# Patient Record
Sex: Male | Born: 1975 | Hispanic: Yes | Marital: Single | Smoking: Never smoker
Health system: Southern US, Community
[De-identification: ages and names within clinical notes are randomized; demographics above are authoritative.]

## PROBLEM LIST (undated history)

## (undated) DIAGNOSIS — E785 Hyperlipidemia, unspecified: Secondary | ICD-10-CM

## (undated) DIAGNOSIS — I639 Cerebral infarction, unspecified: Secondary | ICD-10-CM

## (undated) DIAGNOSIS — I10 Essential (primary) hypertension: Secondary | ICD-10-CM

## (undated) DIAGNOSIS — Z789 Other specified health status: Secondary | ICD-10-CM

## (undated) HISTORY — DX: Cerebral infarction, unspecified: I63.9

## (undated) HISTORY — PX: NO PAST SURGERIES: SHX2092

## (undated) HISTORY — DX: Essential (primary) hypertension: I10

## (undated) HISTORY — DX: Hyperlipidemia, unspecified: E78.5

## (undated) HISTORY — PX: APPENDECTOMY: SHX54

---

## 2015-08-17 ENCOUNTER — Emergency Department (HOSPITAL_COMMUNITY): Payer: Self-pay

## 2015-08-17 ENCOUNTER — Inpatient Hospital Stay (HOSPITAL_COMMUNITY): Payer: Self-pay

## 2015-08-17 ENCOUNTER — Encounter (HOSPITAL_COMMUNITY): Payer: Self-pay

## 2015-08-17 ENCOUNTER — Inpatient Hospital Stay (HOSPITAL_COMMUNITY)
Admission: EM | Admit: 2015-08-17 | Discharge: 2015-08-19 | DRG: 064 | Disposition: A | Discharge status: Home / Self Care | Payer: Self-pay | Attending: Internal Medicine | Admitting: Internal Medicine

## 2015-08-17 DIAGNOSIS — E785 Hyperlipidemia, unspecified: Secondary | ICD-10-CM | POA: Diagnosis present

## 2015-08-17 DIAGNOSIS — N19 Unspecified kidney failure: Secondary | ICD-10-CM

## 2015-08-17 DIAGNOSIS — R471 Dysarthria and anarthria: Secondary | ICD-10-CM | POA: Diagnosis present

## 2015-08-17 DIAGNOSIS — R748 Abnormal levels of other serum enzymes: Secondary | ICD-10-CM | POA: Diagnosis present

## 2015-08-17 DIAGNOSIS — R9389 Abnormal findings on diagnostic imaging of other specified body structures: Secondary | ICD-10-CM | POA: Insufficient documentation

## 2015-08-17 DIAGNOSIS — I161 Hypertensive emergency: Secondary | ICD-10-CM | POA: Diagnosis present

## 2015-08-17 DIAGNOSIS — I6783 Posterior reversible encephalopathy syndrome: Secondary | ICD-10-CM | POA: Diagnosis present

## 2015-08-17 DIAGNOSIS — H539 Unspecified visual disturbance: Secondary | ICD-10-CM | POA: Diagnosis present

## 2015-08-17 DIAGNOSIS — D72829 Elevated white blood cell count, unspecified: Secondary | ICD-10-CM

## 2015-08-17 DIAGNOSIS — I739 Peripheral vascular disease, unspecified: Secondary | ICD-10-CM | POA: Diagnosis present

## 2015-08-17 DIAGNOSIS — I639 Cerebral infarction, unspecified: Secondary | ICD-10-CM

## 2015-08-17 DIAGNOSIS — N289 Disorder of kidney and ureter, unspecified: Secondary | ICD-10-CM

## 2015-08-17 DIAGNOSIS — N281 Cyst of kidney, acquired: Secondary | ICD-10-CM | POA: Diagnosis present

## 2015-08-17 DIAGNOSIS — I160 Hypertensive urgency: Secondary | ICD-10-CM | POA: Diagnosis present

## 2015-08-17 DIAGNOSIS — I6330 Cerebral infarction due to thrombosis of unspecified cerebral artery: Principal | ICD-10-CM | POA: Diagnosis present

## 2015-08-17 DIAGNOSIS — E876 Hypokalemia: Secondary | ICD-10-CM | POA: Diagnosis present

## 2015-08-17 DIAGNOSIS — N179 Acute kidney failure, unspecified: Secondary | ICD-10-CM | POA: Diagnosis not present

## 2015-08-17 DIAGNOSIS — R000 Tachycardia, unspecified: Secondary | ICD-10-CM | POA: Diagnosis present

## 2015-08-17 DIAGNOSIS — Z8249 Family history of ischemic heart disease and other diseases of the circulatory system: Secondary | ICD-10-CM

## 2015-08-17 DIAGNOSIS — I10 Essential (primary) hypertension: Secondary | ICD-10-CM

## 2015-08-17 HISTORY — DX: Other specified health status: Z78.9

## 2015-08-17 LAB — BASIC METABOLIC PANEL
Anion gap: 11 (ref 5–15)
BUN: 25 mg/dL — ABNORMAL HIGH (ref 6–20)
CALCIUM: 8.7 mg/dL — AB (ref 8.9–10.3)
CO2: 23 mmol/L (ref 22–32)
CREATININE: 1.25 mg/dL — AB (ref 0.61–1.24)
Chloride: 104 mmol/L (ref 101–111)
Glucose, Bld: 118 mg/dL — ABNORMAL HIGH (ref 65–99)
Potassium: 3.3 mmol/L — ABNORMAL LOW (ref 3.5–5.1)
SODIUM: 138 mmol/L (ref 135–145)

## 2015-08-17 LAB — TSH: TSH: 1.202 u[IU]/mL (ref 0.350–4.500)

## 2015-08-17 LAB — CBC WITH DIFFERENTIAL/PLATELET
BASOS ABS: 0 10*3/uL (ref 0.0–0.1)
Basophils Relative: 0 %
EOS PCT: 0 %
Eosinophils Absolute: 0 10*3/uL (ref 0.0–0.7)
HCT: 41.5 % (ref 39.0–52.0)
Hemoglobin: 14.6 g/dL (ref 13.0–17.0)
LYMPHS PCT: 9 %
Lymphs Abs: 1.1 10*3/uL (ref 0.7–4.0)
MCH: 31.3 pg (ref 26.0–34.0)
MCHC: 35.2 g/dL (ref 30.0–36.0)
MCV: 88.9 fL (ref 78.0–100.0)
Monocytes Absolute: 0.5 10*3/uL (ref 0.1–1.0)
Monocytes Relative: 4 %
NEUTROS ABS: 10.3 10*3/uL — AB (ref 1.7–7.7)
NEUTROS PCT: 87 %
PLATELETS: 232 10*3/uL (ref 150–400)
RBC: 4.67 MIL/uL (ref 4.22–5.81)
RDW: 13.2 % (ref 11.5–15.5)
WBC: 12 10*3/uL — AB (ref 4.0–10.5)

## 2015-08-17 LAB — TROPONIN I: Troponin I: 0.04 ng/mL — ABNORMAL HIGH (ref <0.031)

## 2015-08-17 LAB — MAGNESIUM: MAGNESIUM: 2.2 mg/dL (ref 1.7–2.4)

## 2015-08-17 LAB — MRSA PCR SCREENING: MRSA BY PCR: NEGATIVE

## 2015-08-17 MED ORDER — ACETAMINOPHEN 650 MG RE SUPP: 650.0000 mg | Freq: Four times a day (QID) | RECTAL | Status: DC | PRN

## 2015-08-17 MED ORDER — POTASSIUM CHLORIDE CRYS ER 20 MEQ PO TBCR
40.0000 meq | EXTENDED_RELEASE_TABLET | Freq: Once | ORAL | Status: AC
  Administered 2015-08-17: 40 meq via ORAL
  Filled 2015-08-17: qty 2

## 2015-08-17 MED ORDER — ONDANSETRON HCL 4 MG/2ML IJ SOLN: 4.0000 mg | Freq: Four times a day (QID) | INTRAMUSCULAR | Status: DC | PRN

## 2015-08-17 MED ORDER — ONDANSETRON HCL 4 MG PO TABS: 4.0000 mg | ORAL_TABLET | Freq: Four times a day (QID) | ORAL | Status: DC | PRN

## 2015-08-17 MED ORDER — ACETAMINOPHEN 325 MG PO TABS
650.0000 mg | ORAL_TABLET | Freq: Four times a day (QID) | ORAL | Status: DC | PRN
  Administered 2015-08-18: 650 mg via ORAL
  Filled 2015-08-17: qty 2

## 2015-08-17 MED ORDER — LABETALOL HCL 5 MG/ML IV SOLN
0.5000 mg/min | INTRAVENOUS | Status: DC
  Administered 2015-08-17: 0.5 mg/min via INTRAVENOUS
  Filled 2015-08-17: qty 20

## 2015-08-17 MED ORDER — LABETALOL HCL 5 MG/ML IV SOLN
20.0000 mg | Freq: Once | INTRAVENOUS | Status: AC
  Administered 2015-08-17: 20 mg via INTRAVENOUS
  Filled 2015-08-17: qty 4

## 2015-08-17 MED ORDER — SODIUM CHLORIDE 0.45 % IV SOLN
INTRAVENOUS | Status: DC
  Administered 2015-08-17 – 2015-08-18 (×2): via INTRAVENOUS

## 2015-08-17 MED ORDER — LORAZEPAM 2 MG/ML IJ SOLN
1.0000 mg | Freq: Once | INTRAMUSCULAR | Status: AC
  Administered 2015-08-17: 1 mg via INTRAVENOUS
  Filled 2015-08-17: qty 1

## 2015-08-17 MED ORDER — MORPHINE SULFATE (PF) 2 MG/ML IV SOLN: 2.0000 mg | INTRAVENOUS | Status: DC | PRN

## 2015-08-17 MED ORDER — ENOXAPARIN SODIUM 40 MG/0.4ML ~~LOC~~ SOLN
40.0000 mg | SUBCUTANEOUS | Status: DC
  Administered 2015-08-17 – 2015-08-18 (×2): 40 mg via SUBCUTANEOUS
  Filled 2015-08-17 (×2): qty 0.4

## 2015-08-17 NOTE — ED Notes (Signed)
Delay in lab due to pt not being in room

## 2015-08-17 NOTE — Progress Notes (Signed)
Merdis DelayK. Schorr, paged regarding patient SBP 130-150 per last hour (2015-2100). K. Schorr instructed RN to titrate labetolol to 0.25 mg/min. Will continue to monitor.

## 2015-08-17 NOTE — H&P (Signed)
[facility] History and Physical  Chris Phelps WUJ:[id number] DOB: [birth year] DOA: 08/17/2015  Referring physician: Dr. Doug Sou, EDP PCP: No primary care provider on file.  Specialists:   Chief Complaint: Elevated blood pressure, right visual changes  HPI: Chris Phelps is a 40 y.o. male  With no past medical history, presented to the emergency department with complaints of high blood pressure and decreased vision. It seems the patient had presented to [facility] optometry or center today, as he has been having decreased vision with blurriness in the right eye for the past 11 days. Patient was told that he needed to see a retinal specialist. Patient was also told that he had an elevated pressure in the left eye versus the right eye. Patient denies any recent illness or travel. Has had a cough for the last several days. Denies any chest pain, shortness of breath, abdominal pain, nausea or vomiting. In the emergency department, CT head was unremarkable for acute findings. Patient was noted to have an elevated blood pressure of 268/142 upon arrival to the emergency department. He was given labetalol injections twice. Patient was found to have hypokalemia, potassium 3.3, creatinine 1.25, WBC 12. [facility] called for admission.  Review of Systems:  Constitutional: Denies fever, chills, diaphoresis, appetite change and fatigue.  HEENT: Denies photophobia, eye pain, redness, hearing loss, ear pain, congestion, sore throat, rhinorrhea, sneezing, mouth sores, trouble swallowing, neck pain, neck stiffness and tinnitus.   Respiratory: Denies SOB, DOE, cough, chest tightness,  and wheezing.   Cardiovascular: Denies chest pain, palpitations and leg swelling.  Gastrointestinal: Denies nausea, vomiting, abdominal pain, diarrhea, constipation, blood in stool and abdominal distention.  Genitourinary: Denies dysuria, urgency, frequency, hematuria, flank pain and difficulty urinating.  Musculoskeletal:  Denies myalgias, back pain, joint swelling, arthralgias and gait problem.  Skin: Denies pallor, rash and wound.  Neurological: Complains of decreased vision and blurring in the right eye.  Hematological: Denies adenopathy. Easy bruising, personal or family bleeding history  Psychiatric/Behavioral: Denies suicidal ideation, mood changes, confusion, nervousness, sleep disturbance and agitation  Past Medical History  Diagnosis Date  . Medical history non-contributory    Past Surgical History  Procedure Laterality Date  . No past surgeries     Social History:  reports that he has never smoked. He has never used smokeless tobacco. He reports that he does not drink alcohol or use illicit drugs.   No Known Allergies  Family History  Problem Relation Age of Onset  . Hypertension     Prior to Admission medications   Medication Sig Start Date End Date Taking? Authorizing Provider  ibuprofen (ADVIL,MOTRIN) 200 MG tablet Take 200 mg by mouth every 6 (six) hours as needed for fever, headache, mild pain, moderate pain or cramping.   Yes Historical Provider, MD   Physical Exam: Filed Vitals:   08/17/15 1600 08/17/15 1615  BP: 207/124 240/151  Pulse: 78 79  Temp:    Resp: 16 22     General: Well developed, well nourished, NAD, appears stated age  HEENT: NCAT, PERRLA-pupils dilated 4mm, EOMI, Anicteic Sclera, mucous membranes moist.   Neck: Supple, no JVD, no masses  Cardiovascular: S1 S2 auscultated, no rubs, murmurs or gallops. Tachycardic  Respiratory: Clear to auscultation bilaterally with equal chest rise  Abdomen: Soft, nontender, nondistended, + bowel sounds  Extremities: warm dry without cyanosis clubbing or edema  Neuro: AAOx3, cranial nerves grossly intact. Right visual field defects- can see peripherally however not straight ahead, pupils dilated. Strength 5/5 in patient's upper  and lower extremities bilaterally  Skin: Without rashes exudates or nodules  Psych: Normal  affect and demeanor with intact judgement and insight  Labs on Admission:  Basic Metabolic Panel:  Recent Labs Lab 08/17/15 1443  NA 138  K 3.3*  CL 104  CO2 23  GLUCOSE 118*  BUN 25*  CREATININE 1.25*  CALCIUM 8.7*   Liver Function Tests: No results for input(s): AST, ALT, ALKPHOS, BILITOT, PROT, ALBUMIN in the last 168 hours. No results for input(s): LIPASE, AMYLASE in the last 168 hours. No results for input(s): AMMONIA in the last 168 hours. CBC:  Recent Labs Lab 08/17/15 1443  WBC 12.0*  NEUTROABS 10.3*  HGB 14.6  HCT 41.5  MCV 88.9  PLT 232   Cardiac Enzymes: No results for input(s): CKTOTAL, CKMB, CKMBINDEX, TROPONINI in the last 168 hours.  BNP (last 3 results) No results for input(s): BNP in the last 8760 hours.  ProBNP (last 3 results) No results for input(s): PROBNP in the last 8760 hours.  CBG: No results for input(s): GLUCAP in the last 168 hours.  Radiological Exams on Admission: Ct Head Wo Contrast  08/17/2015  CLINICAL DATA:  40 year old male with vision changes in the right eye for 11 days. Hypertensive. Initial encounter. EXAM: CT HEAD WITHOUT CONTRAST TECHNIQUE: Contiguous axial images were obtained from the base of the skull through the vertex without intravenous contrast. COMPARISON:  None. FINDINGS: No acute osseous abnormality identified. Scattered mostly small paranasal sinus mucous retention cyst. Mastoids are clear. Orbits soft tissues appear normal. Visualized scalp soft tissues are within normal limits. Cerebral volume is normal. Multifocal abnormal hypodense areas in the cerebral white matter and basal ganglia bilaterally suggestion of a small area of cortical encephalomalacia in the right parietal lobe on series 2, image 20. No cortically based acute infarct identified. No midline shift, mass effect, or evidence of intracranial mass lesion. No acute intracranial hemorrhage identified. No ventriculomegaly. No suspicious intracranial vascular  hyperdensity. IMPRESSION: 1. Heterogeneous density in the bilateral cerebral white matter and basal ganglia is nonspecific but may indicate very age advanced small vessel disease in this setting. 2. Otherwise no acute intracranial abnormality. Electronically Signed   By: Odessa FlemingH  Hall M.D.   On: 08/17/2015 14:35    EKG: Independently reviewed. Sinus tachycardia, rate 104, Abnormal Twave, LVH  Assessment/Plan  Hypertensive Urgency -Will admit patient to stepdown placed on labetalol infusion -CT head: Heterogeneous density in the bilateral cerebral white matter and basal ganglia, no acute intracranial normality -MRI brain pending  Visual changes -Has been ongoing for approximately 11 days -Possibly ongoing from HTN? -CT head as above -Pending MRI brain -Spoke with ophthalmologist, Dr. Fabian SharpScott Groat, who will follow up with patient as an outpatient.  Leukocytosis -Patient has complained of cough over the last several days -Denies shortness of breath -Have asked EDP to obtain chest x-ray and UA -Currently afebrile -Will defer antibiotics for now -Continue to monitor CBC  Mild renal insufficiency -No baseline creatinine -Creatinine at admission 1.25 -Placed on gentle IVF and continue to monitor BMP  Hypokalemia -Will obtain magnesium level, replace potassium -Monitor BMP  Sinus tachycardia/abnormal EKG -TSH 1.202 -Spoke with Dr. [place] to review EKG, felt EKG changes related to LVH and strain. -Will obtain echocardiogram -Will repeat EKG in the a.m.  DVT prophylaxis: Lovenox  Code Status: Full  Condition: guarded   Family Communication: Friend at bedside. Admission, patients condition and plan of care including tests being ordered have been discussed with the patient and friend  who indicate understanding and agree with the plan and Code Status.  Disposition Plan: Admitted to step down unit.  Time spent: 75 minutes  Halil Rentz D.O. [facility] Pager  [phone]  If 7PM-7AM, please contact night-coverage [email or web address] Password [email or web address] 08/17/2015, 4:31 PM

## 2015-08-17 NOTE — ED Notes (Signed)
Pt c/o vision change in R eye x 11 days.  Denies pain.  Pt was seen at eye doctor today and found to be hypertensive.  Visitor sts "it was 270 over something."  Eye doctor has referred Pt to a retinal specialist.  Denies medical history.

## 2015-08-17 NOTE — ED Provider Notes (Addendum)
CSN: [id number]     Arrival date & time 08/17/15  1217 History   First MD Initiated Contact with Patient 08/17/15 1316     Chief Complaint  Patient presents with  . Hypertension  . Visual Field Change     (Consider location/radiation/quality/duration/timing/severity/associated sxs/prior Treatment) HPI patient went to optometrist today as he's been having blurred vision in his right eye for the past 11 days. He was sent here for evaluation of elevated blood pressure. Patient is asymptomatic except for blurred vision in his right eye. Denies headache denies chest pain denies shortness of breath denies other associated symptoms. No treatment prior to coming here, other than eyedrops placed in both eyes by optometrist.   History reviewed. No pertinent past medical history. Past history negative History reviewed. No pertinent past surgical history. History reviewed. No pertinent family history. Social History  Substance Use Topics  . Smoking status: Never Smoker   . Smokeless tobacco: None  . Alcohol Use: No    Review of Systems  Constitutional: Negative.   HENT: Negative.   Eyes: Positive for visual disturbance.  Respiratory: Negative.   Cardiovascular: Negative.   Gastrointestinal: Negative.   Musculoskeletal: Negative.   Skin: Negative.   Neurological: Negative.   Psychiatric/Behavioral: Negative.   All other systems reviewed and are negative.     Allergies  Review of patient's allergies indicates no known allergies.  Home Medications   Prior to Admission medications   Medication Sig Start Date End Date Taking? Authorizing Provider  ibuprofen (ADVIL,MOTRIN) 200 MG tablet Take 200 mg by mouth every 6 (six) hours as needed for fever, headache, mild pain, moderate pain or cramping.   Yes Historical Provider, MD   BP 266/157 mmHg  Pulse 109  Temp(Src) 98.9 F (37.2 C) (Oral)  Resp 23  SpO2 93% Physical Exam  Constitutional: He appears well-developed and well-nourished.   HENT:  Head: Normocephalic and atraumatic.  Eyes: Conjunctivae and EOM are normal.  Pupils 4-5 mm bilaterally and nonreactive to light visual acuity 20/25 left eye, he is able to see hand motion with right eye but unable to see hand  Neck: Neck supple. No tracheal deviation present. No thyromegaly present.  Cardiovascular: Regular rhythm.   No murmur heard. Tachycardic  Pulmonary/Chest: Effort normal and breath sounds normal.  Abdominal: Soft. Bowel sounds are normal. He exhibits no distension. There is no tenderness.  Musculoskeletal: Normal range of motion. He exhibits no edema or tenderness.  Neurological: He is alert. Coordination normal.  Skin: Skin is warm and dry. No rash noted.  Psychiatric: He has a normal mood and affect.  Nursing note and vitals reviewed.   ED Course  Procedures (including critical care time) Labs Review Labs Reviewed  BASIC METABOLIC PANEL  CBC WITH DIFFERENTIAL/PLATELET  DRUG PROFILE, UR, 9 DRUGS  TSH    Imaging Review No results found. I have personally reviewed and evaluated these images and lab results as part of my medical decision-making.   EKG Interpretation   Date/Time:  Wednesday August 17 2015 12:41:56 EDT Ventricular Rate:  104 PR Interval:  157 QRS Duration: 99 QT Interval:  352 QTC Calculation: 463 R Axis:   100 Text Interpretation:  Sinus tachycardia Consider right atrial enlargement  Abnormal T, consider ischemia, lateral leads No old tracing to compare  Confirmed by Oletha Tolson  MD, Nyasia Baxley [phone]) on 08/17/2015 2:17:19 PM     3:35 PM patient remains markedly hypertensive after treatment with intravenous labetalol. Additional intravenous labetalol ordered. Results for orders placed  or performed during the hospital encounter of 08/17/15  Basic metabolic panel  Result Value Ref Range   Sodium 138 135 - 145 mmol/L   Potassium 3.3 (L) 3.5 - 5.1 mmol/L   Chloride 104 101 - 111 mmol/L   CO2 23 22 - 32 mmol/L   Glucose, Bld 118 (H)  65 - 99 mg/dL   BUN 25 (H) 6 - 20 mg/dL   Creatinine, Ser 1.611.25 (H) 0.61 - 1.24 mg/dL   Calcium 8.7 (L) 8.9 - 10.3 mg/dL   GFR calc non Af Amer >60 >60 mL/min   GFR calc Af Amer >60 >60 mL/min   Anion gap 11 5 - 15  CBC with Differential/Platelet  Result Value Ref Range   WBC 12.0 (H) 4.0 - 10.5 K/uL   RBC 4.67 4.22 - 5.81 MIL/uL   Hemoglobin 14.6 13.0 - 17.0 g/dL   HCT 09.641.5 04.539.0 - 40.952.0 %   MCV 88.9 78.0 - 100.0 fL   MCH 31.3 26.0 - 34.0 pg   MCHC 35.2 30.0 - 36.0 g/dL   RDW 81.113.2 91.411.5 - 78.215.5 %   Platelets 232 150 - 400 K/uL   Neutrophils Relative % 87 %   Neutro Abs 10.3 (H) 1.7 - 7.7 K/uL   Lymphocytes Relative 9 %   Lymphs Abs 1.1 0.7 - 4.0 K/uL   Monocytes Relative 4 %   Monocytes Absolute 0.5 0.1 - 1.0 K/uL   Eosinophils Relative 0 %   Eosinophils Absolute 0.0 0.0 - 0.7 K/uL   Basophils Relative 0 %   Basophils Absolute 0.0 0.0 - 0.1 K/uL  TSH  Result Value Ref Range   TSH 1.202 0.350 - 4.500 uIU/mL   Ct Head Wo Contrast  08/17/2015  CLINICAL DATA:  40 year old male with vision changes in the right eye for 11 days. Hypertensive. Initial encounter. EXAM: CT HEAD WITHOUT CONTRAST TECHNIQUE: Contiguous axial images were obtained from the base of the skull through the vertex without intravenous contrast. COMPARISON:  None. FINDINGS: No acute osseous abnormality identified. Scattered mostly small paranasal sinus mucous retention cyst. Mastoids are clear. Orbits soft tissues appear normal. Visualized scalp soft tissues are within normal limits. Cerebral volume is normal. Multifocal abnormal hypodense areas in the cerebral white matter and basal ganglia bilaterally suggestion of a small area of cortical encephalomalacia in the right parietal lobe on series 2, image 20. No cortically based acute infarct identified. No midline shift, mass effect, or evidence of intracranial mass lesion. No acute intracranial hemorrhage identified. No ventriculomegaly. No suspicious intracranial vascular  hyperdensity. IMPRESSION: 1. Heterogeneous density in the bilateral cerebral white matter and basal ganglia is nonspecific but may indicate very age advanced small vessel disease in this setting. 2. Otherwise no acute intracranial abnormality. Electronically Signed   By: Odessa FlemingH  Hall M.D.   On: 08/17/2015 14:35   He passsed stroke swallow screen Results for orders placed or performed during the hospital encounter of 08/17/15  Basic metabolic panel  Result Value Ref Range   Sodium 138 135 - 145 mmol/L   Potassium 3.3 (L) 3.5 - 5.1 mmol/L   Chloride 104 101 - 111 mmol/L   CO2 23 22 - 32 mmol/L   Glucose, Bld 118 (H) 65 - 99 mg/dL   BUN 25 (H) 6 - 20 mg/dL   Creatinine, Ser 9.561.25 (H) 0.61 - 1.24 mg/dL   Calcium 8.7 (L) 8.9 - 10.3 mg/dL   GFR calc non Af Amer >60 >60 mL/min   GFR calc Af Amer >  60 >60 mL/min   Anion gap 11 5 - 15  CBC with Differential/Platelet  Result Value Ref Range   WBC 12.0 (H) 4.0 - 10.5 K/uL   RBC 4.67 4.22 - 5.81 MIL/uL   Hemoglobin 14.6 13.0 - 17.0 g/dL   HCT 96.0 45.4 - 09.8 %   MCV 88.9 78.0 - 100.0 fL   MCH 31.3 26.0 - 34.0 pg   MCHC 35.2 30.0 - 36.0 g/dL   RDW 11.9 14.7 - 82.9 %   Platelets 232 150 - 400 K/uL   Neutrophils Relative % 87 %   Neutro Abs 10.3 (H) 1.7 - 7.7 K/uL   Lymphocytes Relative 9 %   Lymphs Abs 1.1 0.7 - 4.0 K/uL   Monocytes Relative 4 %   Monocytes Absolute 0.5 0.1 - 1.0 K/uL   Eosinophils Relative 0 %   Eosinophils Absolute 0.0 0.0 - 0.7 K/uL   Basophils Relative 0 %   Basophils Absolute 0.0 0.0 - 0.1 K/uL  TSH  Result Value Ref Range   TSH 1.202 0.350 - 4.500 uIU/mL   Ct Head Wo Contrast  08/17/2015  CLINICAL DATA:  40 year old male with vision changes in the right eye for 11 days. Hypertensive. Initial encounter. EXAM: CT HEAD WITHOUT CONTRAST TECHNIQUE: Contiguous axial images were obtained from the base of the skull through the vertex without intravenous contrast. COMPARISON:  None. FINDINGS: No acute osseous abnormality  identified. Scattered mostly small paranasal sinus mucous retention cyst. Mastoids are clear. Orbits soft tissues appear normal. Visualized scalp soft tissues are within normal limits. Cerebral volume is normal. Multifocal abnormal hypodense areas in the cerebral white matter and basal ganglia bilaterally suggestion of a small area of cortical encephalomalacia in the right parietal lobe on series 2, image 20. No cortically based acute infarct identified. No midline shift, mass effect, or evidence of intracranial mass lesion. No acute intracranial hemorrhage identified. No ventriculomegaly. No suspicious intracranial vascular hyperdensity. IMPRESSION: 1. Heterogeneous density in the bilateral cerebral white matter and basal ganglia is nonspecific but may indicate very age advanced small vessel disease in this setting. 2. Otherwise no acute intracranial abnormality. Electronically Signed   By: Odessa Fleming M.D.   On: 08/17/2015 14:35    MDM  Dr Barnabas Harries. Plan patient will be admitted to stepdown unit for blood pressure control Final diagnoses:  None  Patient has EKG changes and mild renal insufficiency and of unknown chronicity patient has become diminished visual acuity per right eye which is most likely an ophthalmologic problem. He may need ophthalmologic consult  Diagnosis #1 accelerated hypertension #2 hypokalemia #3 renal insufficiency   CRITICAL CARE Performed by: Doug Sou Total critical care time: 35 minutes Critical care time was exclusive of separately billable procedures and treating other patients. Critical care was necessary to treat or prevent imminent or life-threatening deterioration. Critical care was time spent personally by me on the following activities: development of treatment plan with patient and/or surrogate as well as nursing, discussions with consultants, evaluation of patient's response to treatment, examination of patient, obtaining history from patient or  surrogate, ordering and performing treatments and interventions, ordering and review of laboratory studies, ordering and review of radiographic studies, pulse oximetry and re-evaluation of patient's condition.   Doug Sou, MD 08/17/15 1642  Doug Sou, MD 08/17/15 [phone]

## 2015-08-17 NOTE — Progress Notes (Signed)
PATIENT SPEAKS SOME ENGLISH-MAY HAVE TO USE SPANISH INTERPRETER IF CYNTHIA IS NOT WITH HIM

## 2015-08-17 NOTE — ED Notes (Signed)
During the visual acuity patient could not see out of the chart out of his right eye.

## 2015-08-17 NOTE — Progress Notes (Signed)
Upon reassessment, patient lethargic and hard to arouse. HR 69, oxygen saturation 96% on room air, and current BP 143/83, MAP of 104. Patient did arouse for neurological assessment. Pupils 4 mm, PERRLA. No pronator drift noted in upper extremities. Will continue to monitor.

## 2015-08-18 ENCOUNTER — Inpatient Hospital Stay (HOSPITAL_COMMUNITY): Payer: MEDICAID

## 2015-08-18 ENCOUNTER — Encounter (HOSPITAL_COMMUNITY): Payer: Self-pay | Admitting: Radiology

## 2015-08-18 ENCOUNTER — Inpatient Hospital Stay (HOSPITAL_COMMUNITY): Payer: Self-pay

## 2015-08-18 DIAGNOSIS — I10 Essential (primary) hypertension: Secondary | ICD-10-CM

## 2015-08-18 DIAGNOSIS — R9389 Abnormal findings on diagnostic imaging of other specified body structures: Secondary | ICD-10-CM | POA: Insufficient documentation

## 2015-08-18 DIAGNOSIS — I639 Cerebral infarction, unspecified: Secondary | ICD-10-CM

## 2015-08-18 DIAGNOSIS — N19 Unspecified kidney failure: Secondary | ICD-10-CM

## 2015-08-18 DIAGNOSIS — I161 Hypertensive emergency: Secondary | ICD-10-CM

## 2015-08-18 DIAGNOSIS — I6330 Cerebral infarction due to thrombosis of unspecified cerebral artery: Secondary | ICD-10-CM

## 2015-08-18 DIAGNOSIS — I6783 Posterior reversible encephalopathy syndrome: Secondary | ICD-10-CM

## 2015-08-18 DIAGNOSIS — R938 Abnormal findings on diagnostic imaging of other specified body structures: Secondary | ICD-10-CM

## 2015-08-18 LAB — RAPID URINE DRUG SCREEN, HOSP PERFORMED
AMPHETAMINES: NOT DETECTED
BENZODIAZEPINES: NOT DETECTED
Barbiturates: NOT DETECTED
Cocaine: NOT DETECTED
Opiates: NOT DETECTED
Tetrahydrocannabinol: NOT DETECTED

## 2015-08-18 LAB — PROTIME-INR
INR: 1.12 (ref 0.00–1.49)
Prothrombin Time: 14.1 seconds (ref 11.6–15.2)

## 2015-08-18 LAB — URINE MICROSCOPIC-ADD ON

## 2015-08-18 LAB — CBC
HEMATOCRIT: 36.3 % — AB (ref 39.0–52.0)
Hemoglobin: 12.5 g/dL — ABNORMAL LOW (ref 13.0–17.0)
MCH: 31.1 pg (ref 26.0–34.0)
MCHC: 34.4 g/dL (ref 30.0–36.0)
MCV: 90.3 fL (ref 78.0–100.0)
PLATELETS: 206 10*3/uL (ref 150–400)
RBC: 4.02 MIL/uL — ABNORMAL LOW (ref 4.22–5.81)
RDW: 13.5 % (ref 11.5–15.5)
WBC: 8.2 10*3/uL (ref 4.0–10.5)

## 2015-08-18 LAB — TROPONIN I
Troponin I: 0.03 ng/mL (ref <0.031)
Troponin I: 0.04 ng/mL — ABNORMAL HIGH (ref <0.031)

## 2015-08-18 LAB — COMPREHENSIVE METABOLIC PANEL
ALBUMIN: 4 g/dL (ref 3.5–5.0)
ALT: 15 U/L — ABNORMAL LOW (ref 17–63)
AST: 12 U/L — AB (ref 15–41)
Alkaline Phosphatase: 42 U/L (ref 38–126)
Anion gap: 8 (ref 5–15)
BUN: 31 mg/dL — AB (ref 6–20)
CHLORIDE: 106 mmol/L (ref 101–111)
CO2: 25 mmol/L (ref 22–32)
Calcium: 8.2 mg/dL — ABNORMAL LOW (ref 8.9–10.3)
Creatinine, Ser: 1.62 mg/dL — ABNORMAL HIGH (ref 0.61–1.24)
GFR calc Af Amer: >60 mL/min (ref >60)
GFR calc non Af Amer: 52 mL/min — ABNORMAL LOW (ref >60)
GLUCOSE: 112 mg/dL — AB (ref 65–99)
POTASSIUM: 3.1 mmol/L — AB (ref 3.5–5.1)
SODIUM: 139 mmol/L (ref 135–145)
Total Bilirubin: 0.7 mg/dL (ref 0.3–1.2)
Total Protein: 6.3 g/dL — ABNORMAL LOW (ref 6.5–8.1)

## 2015-08-18 LAB — DRUG PROFILE, UR, 9 DRUGS (LABCORP)
AMPHETAMINES, URINE: NEGATIVE ng/mL
BENZODIAZEPINE QUANT UR: NEGATIVE ng/mL
Barbiturate, Ur: NEGATIVE ng/mL
CANNABINOID QUANT UR: NEGATIVE ng/mL
Cocaine (Metab.): NEGATIVE ng/mL
METHADONE SCREEN, URINE: NEGATIVE ng/mL
Opiate Quant, Ur: NEGATIVE ng/mL
PHENCYCLIDINE, UR: NEGATIVE ng/mL
Propoxyphene, Urine: NEGATIVE ng/mL

## 2015-08-18 LAB — URINALYSIS, ROUTINE W REFLEX MICROSCOPIC
BILIRUBIN URINE: NEGATIVE
GLUCOSE, UA: 100 mg/dL — AB
HGB URINE DIPSTICK: NEGATIVE
Ketones, ur: NEGATIVE mg/dL
Nitrite: NEGATIVE
PH: 6.5 (ref 5.0–8.0)
Protein, ur: 30 mg/dL — AB

## 2015-08-18 LAB — LIPID PANEL
CHOLESTEROL: 212 mg/dL — AB (ref 0–200)
HDL: 40 mg/dL — ABNORMAL LOW (ref >40)
LDL CALC: 143 mg/dL — AB (ref 0–99)
Total CHOL/HDL Ratio: 5.3 RATIO
Triglycerides: 145 mg/dL (ref <150)
VLDL: 29 mg/dL (ref 0–40)

## 2015-08-18 LAB — ECHOCARDIOGRAM COMPLETE
HEIGHTINCHES: 62 in
Weight: 2878.33 oz

## 2015-08-18 MED ORDER — STROKE: EARLY STAGES OF RECOVERY BOOK
Freq: Once | Status: AC
  Administered 2015-08-18: 08:00:00
  Filled 2015-08-18: qty 1

## 2015-08-18 MED ORDER — LABETALOL HCL 5 MG/ML IV SOLN
10.0000 mg | Freq: Four times a day (QID) | INTRAVENOUS | Status: DC | PRN
  Filled 2015-08-18: qty 4

## 2015-08-18 MED ORDER — SODIUM CHLORIDE 0.9 % IV BOLUS (SEPSIS)
500.0000 mL | Freq: Once | INTRAVENOUS | Status: AC
  Administered 2015-08-18: 500 mL via INTRAVENOUS

## 2015-08-18 MED ORDER — LABETALOL HCL 5 MG/ML IV SOLN
10.0000 mg | INTRAVENOUS | Status: DC | PRN
  Filled 2015-08-18: qty 4

## 2015-08-18 MED ORDER — IOPAMIDOL (ISOVUE-370) INJECTION 76%
100.0000 mL | Freq: Once | INTRAVENOUS | Status: AC | PRN
  Administered 2015-08-18: 100 mL via INTRAVENOUS

## 2015-08-18 MED ORDER — LABETALOL HCL 5 MG/ML IV SOLN
0.5000 mg/min | INTRAVENOUS | Status: DC
  Filled 2015-08-18: qty 100

## 2015-08-18 MED ORDER — SODIUM CHLORIDE 0.9 % IV BOLUS (SEPSIS)
750.0000 mL | Freq: Once | INTRAVENOUS | Status: AC
  Administered 2015-08-18: 750 mL via INTRAVENOUS

## 2015-08-18 MED ORDER — HYDRALAZINE HCL 20 MG/ML IJ SOLN
5.0000 mg | INTRAMUSCULAR | Status: DC | PRN
  Administered 2015-08-18 – 2015-08-19 (×4): 5 mg via INTRAVENOUS
  Filled 2015-08-18 (×4): qty 1

## 2015-08-18 MED ORDER — CLOPIDOGREL BISULFATE 75 MG PO TABS
75.0000 mg | ORAL_TABLET | Freq: Every day | ORAL | Status: DC
  Administered 2015-08-18 – 2015-08-19 (×2): 75 mg via ORAL
  Filled 2015-08-18 (×2): qty 1

## 2015-08-18 MED ORDER — POLYVINYL ALCOHOL 1.4 % OP SOLN
1.0000 [drp] | OPHTHALMIC | Status: DC | PRN
  Administered 2015-08-18: 1 [drp] via OPHTHALMIC
  Filled 2015-08-18: qty 15

## 2015-08-18 MED ORDER — NICARDIPINE HCL IN NACL 20-0.86 MG/200ML-% IV SOLN
3.0000 mg/h | INTRAVENOUS | Status: DC
  Administered 2015-08-18: 3 mg/h via INTRAVENOUS
  Filled 2015-08-18: qty 200

## 2015-08-18 MED ORDER — SODIUM CHLORIDE 0.9 % IV BOLUS (SEPSIS)
250.0000 mL | Freq: Once | INTRAVENOUS | Status: AC
  Administered 2015-08-18: 250 mL via INTRAVENOUS

## 2015-08-18 MED ORDER — ASPIRIN 81 MG PO CHEW
81.0000 mg | CHEWABLE_TABLET | Freq: Every day | ORAL | Status: DC
  Filled 2015-08-18: qty 1

## 2015-08-18 MED ORDER — POTASSIUM CHLORIDE CRYS ER 20 MEQ PO TBCR
40.0000 meq | EXTENDED_RELEASE_TABLET | ORAL | Status: AC
  Administered 2015-08-18 (×2): 40 meq via ORAL
  Filled 2015-08-18 (×2): qty 2

## 2015-08-18 NOTE — Care Management Note (Signed)
Case Management Note  Patient Details  Name: Chris Phelps MRN: [id number] Date of Birth: 1976/01/17  Subjective/Objective:        Hypertensive crisis with visual changes            Action/Plan:Date:  August 18, 2015 Chart reviewed for concurrent status and case management needs. Will continue to follow patient for changes and needs: Marcelle Smilinghonda Davis, BSN, RN, [place]   [phone]   Expected Discharge Date:   (UNKNOWN)               Expected Discharge Plan:  Home/Self Care  In-House Referral:  NA  Discharge planning Services  CM Consult  Post Acute Care Choice:  NA Choice offered to:  NA  DME Arranged:    DME Agency:     HH Arranged:    HH Agency:     Status of Service:  Completed, signed off  Medicare Important Message Given:    Date Medicare IM Given:    Medicare IM give by:    Date Additional Medicare IM Given:    Additional Medicare Important Message give by:     If discussed at Long Length of Stay Meetings, dates discussed:    Additional Comments:  Golda AcreDavis, Rhonda Lynn, RN 08/18/2015, 11:58 AM

## 2015-08-18 NOTE — Progress Notes (Signed)
[facility] called and notified of tx to 5C-16.

## 2015-08-18 NOTE — Progress Notes (Signed)
Report called to Rich,RN on 5C. Pt remains comfortable with not complaints at this time. Fiance at bedside at this time and notified of transfer to 5C-16.

## 2015-08-18 NOTE — Progress Notes (Signed)
Echocardiogram 2D Echocardiogram has been performed.  Dorothey BasemanReel, Yariah Selvey M 08/18/2015, 10:31 AM

## 2015-08-18 NOTE — Progress Notes (Signed)
This RN went to room to recheck BP. Friend of pt questioned this RN why pt is so sleepy and will not respond to her. This RN called pt name with no response but a small groan. This RN performed a Sternal rub which illicited pt pulling arms up to face area but not opening eyes and a groan. Second Sternal rub performed which illicited an attempt to push this RN's hands away but no commands followed and not opening eyes. When this RN attempted to open pt's eyes, pt closed eyes tighter. Bedside RN aware and will notify MD.

## 2015-08-18 NOTE — Progress Notes (Signed)
[facility] arrived to transfer pt to 5C-16, Rich,RN called and notified.

## 2015-08-18 NOTE — Consult Note (Signed)
Referring Physician: Dr Butler Denmark    Chief Complaint: Visual Disturbance  HPI: Chris Phelps is an Hispanic 40 y.o. male who speaks limited English. Most of the history was obtained from the patient's chart and from his fiance who is at the bedside. The patient has had no regular medical follow-up over the past 10 years. No medications prior to admission. 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 Approximately 10 or 11 days ago the patient came home and complained of partial visual loss in his right eye. He denied headache or other symptoms; however, on further questioning he patient has had some recent fatigue, occasional pounding rapid heart rates associated with activity, and a mild dry cough.  After several days of visual problems his fiance talked to him into having his eyes examined. During that visit his blood pressure was checked and found to be abnormally high. He was instructed to come to the emergency department. On arrival his blood pressure was reportedly to 268/142. The patient was admitted and placed on a labetalol drip. An MRI as noted below revealed possible Pres versus punctate infarcts infarcts of the right basal ganglia and left parietal lobe. Currently the patient has continued visual problems with his right eye although he feels this is somewhat improved. He denies any other symptoms except for fatigue. His fiance notes that he does have a history of some stuttering speech when he is anxious or excited and she has noted some possible recent mild confusion.  Date last known well: Unable to determine Time last known well: Unable to determine tPA Given: No: Out of window  Past Medical History  Diagnosis Date  . Medical history non-contributory     Past Surgical History  Procedure Laterality Date  . No past surgeries     Family history -  Father - died at an early age from complications of diabetes Mother - still living. Possibly has  hypertension. Family history of hypertension. No known family history of strokes.   Social History:  reports that he has never smoked. He has never used smokeless tobacco. He reports that he does not drink alcohol or use illicit drugs. Quit alcohol 6 years ago. The patient has 1 daughter.  Allergies: No Known Allergies  Medications:  Scheduled: . aspirin  81 mg Oral Daily  . enoxaparin (LOVENOX) injection  40 mg Subcutaneous Q24H  . potassium chloride  40 mEq Oral Q4H    ROS: History obtained from the patient and fiance  General ROS: negative for - chills, fever, night sweats, weight gain or weight loss. Positive for recent fatigue Psychological ROS: negative for - behavioral disorder, hallucinations, memory difficulties, mood swings or suicidal ideation Ophthalmic ROS: negative for - blurry vision, double vision, eye pain or loss of vision ENT ROS: negative for - epistaxis, nasal discharge, oral lesions, sore throat, tinnitus or vertigo Allergy and Immunology ROS: negative for - hives or itchy/watery eyes Hematological and Lymphatic ROS: negative for - bleeding problems, bruising or swollen lymph nodes Endocrine ROS: negative for - galactorrhea, hair pattern changes, polydipsia/polyuria or temperature intolerance Respiratory ROS: negative for - hemoptysis, shortness of breath or wheezing. Positive for a dry cough. Cardiovascular ROS: negative for - chest pain, dyspnea on exertion, edema or irregular heartbeat. Positive for rapid pounding heart rates with activity Gastrointestinal ROS: negative for - abdominal pain, diarrhea, hematemesis, nausea/vomiting or stool incontinence Genito-Urinary ROS: negative for - dysuria, hematuria, incontinence or urinary frequency/urgency Musculoskeletal ROS: negative for - joint swelling or muscular  weakness Neurological ROS: as noted in HPI Dermatological ROS: negative for rash and skin lesion changes   Physical Examination: Blood pressure  188/103, pulse 62, temperature 98.9 F (37.2 C), temperature source Oral, resp. rate 15, height 5\' 2"  (1.575 m), weight 81.6 kg (179 lb 14.3 oz), SpO2 98 %.  General - pleasant, somewhat stoic, male in no acute distress. Heart - Regular rate and rhythm - no murmer appreciated Lungs - Clear to auscultation Abdomen - Soft - non tender Extremities - Distal pulses intact - no edema Skin - Warm and dry   Mental Status: Alert, oriented, thought content appropriate.  Speech - one word mumbled answers.  Able to follow 3 step commands with cueing possibly due to communication difficulties.. Cranial Nerves: II: Discs not visualized; Visual fields grossly normal, pupils equal, round, reactive to light and accommodation III,IV, VI: ptosis not present, extra-ocular motions intact bilaterally V,VII: smile symmetric, facial light touch sensation normal bilaterally VIII: hearing normal bilaterally IX,X: gag reflex present XI: bilateral shoulder shrug XII: midline tongue extension Motor: Right : Upper extremity   5/5    Left:     Upper extremity   5/5  Lower extremity   5/5     Lower extremity   5/5 Tone and bulk:normal tone throughout; no atrophy noted Sensory: Light touch intact throughout, bilaterally Deep Tendon Reflexes: 2+ and symmetric throughout Plantars: Right: Equivocal   Left: downgoing Cerebellar: normal finger-to-nose, rapid alternating movements somewhat slow. Normal heel-to-shin test Gait: Deferred CV: pulses palpable throughout     Laboratory Studies:  Basic Metabolic Panel:  Recent Labs Lab 08/17/15 1443 08/17/15 1857 08/18/15 0634  NA 138  --  139  K 3.3*  --  3.1*  CL 104  --  106  CO2 23  --  25  GLUCOSE 118*  --  112*  BUN 25*  --  31*  CREATININE 1.25*  --  1.62*  CALCIUM 8.7*  --  8.2*  MG  --  2.2  --     Liver Function Tests:  Recent Labs Lab 08/18/15 0634  AST 12*  ALT 15*  ALKPHOS 42  BILITOT 0.7  PROT 6.3*  ALBUMIN 4.0   No results for  input(s): LIPASE, AMYLASE in the last 168 hours. No results for input(s): AMMONIA in the last 168 hours.  CBC:  Recent Labs Lab 08/17/15 1443 08/18/15 0634  WBC 12.0* 8.2  NEUTROABS 10.3*  --   HGB 14.6 12.5*  HCT 41.5 36.3*  MCV 88.9 90.3  PLT 232 206    Cardiac Enzymes:  Recent Labs Lab 08/17/15 1857 08/18/15 0052 08/18/15 0634  TROPONINI 0.04* 0.03 0.04*    BNP: Invalid input(s): POCBNP  CBG: No results for input(s): GLUCAP in the last 168 hours.  Microbiology: Results for orders placed or performed during the hospital encounter of 08/17/15  MRSA PCR Screening     Status: None   Collection Time: 08/17/15  6:35 PM  Result Value Ref Range Status   MRSA by PCR NEGATIVE NEGATIVE Final    Comment:        The GeneXpert MRSA Assay (FDA approved for NASAL specimens only), is one component of a comprehensive MRSA colonization surveillance program. It is not intended to diagnose MRSA infection nor to guide or monitor treatment for MRSA infections.     Coagulation Studies: No results for input(s): LABPROT, INR in the last 72 hours.  Urinalysis: No results for input(s): COLORURINE, LABSPEC, PHURINE, GLUCOSEU, HGBUR, BILIRUBINUR, KETONESUR, PROTEINUR, UROBILINOGEN,  NITRITE, LEUKOCYTESUR in the last 168 hours.  Invalid input(s): APPERANCEUR  Lipid Panel: No results found for: CHOL, TRIG, HDL, CHOLHDL, VLDL, LDLCALC  HgbA1C: No results found for: HGBA1C  Urine Drug Screen:  No results found for: LABOPIA, COCAINSCRNUR, LABBENZ, AMPHETMU, THCU, LABBARB  Alcohol Level: No results for input(s): ETH in the last 168 hours.  Other results: EKG: ST rate 104 BPM. Possible lateral ischemia. LVH. Await formal cardiology reading.  Imaging:   Dg Chest 2 View 08/17/2015   Mild cardiomegaly.  No active lung disease.   Ct Head Wo Contrast 08/17/2015   1. Heterogeneous density in the bilateral cerebral white matter and basal ganglia is nonspecific but may indicate very  age advanced small vessel disease in this setting.  2. Otherwise no acute intracranial abnormality.    Mr Brain Wo Contrast 08/17/2015   ADDENDUM REPORT: 08/17/2015 18:37 ADDENDUM: There is suggestion of mild cortical FLAIR hyperintensity in the left greater than right parietal lobes (series 7, image 19) with mildly asymmetric subcortical T2 hyperintensity on the left. This could reflect mild changes of PRES. Cerebellar and brainstem signal changes could also potentially reflect a component of PRES in addition to chronic ischemic disease.  08/17/2015   1. Punctate acute infarct in the right basal ganglia.  2. Punctate acute cortical infarct versus artifact in the left parietal lobe.  3. T2 signal changes throughout the cerebral white matter, deep gray nuclei, brainstem, and cerebellum with chronic hemorrhages and lacunar infarcts as above, most consistent with markedly advanced chronic small vessel ischemic disease in the setting of chronic hypertension.    US Renal 08/18/2015   Tiny RIGHT renal cyst 12 mm greatest size. Otherwise negative exam.    CTA HEAD and NECK  08/18/2015 Acute infarct right basal ganglia seen on recent MRI. No significant carotid or vertebral artery stenosis in the neck No significant intracranial stenosis.     Assessment:  40 y.o. male admitted to [facility] 08/17/2015 with visual changes of the right eye. Blood pressure 268/142 admission. MRI - possible Pres versus punctate infarcts infarcts of the right basal ganglia and left parietal lobe. 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 Discussed with Dr. Daralene Milch - full stroke workup and treat hypertension. Systolic blood pressure goal - 120 to 140. Change Labetalol drip to Cardene. Change ASA 81 to Plavix 75 mg daily.  Stroke Risk Factors - hypertension  Plan: 1. HgbA1c, fasting lipid panel 2. MRI, MRA  of the brain without contrast 3. PT consult, OT consult, Speech consult 4.  Echocardiogram 5. Carotid dopplers 6. Prophylactic therapy-Antiplatelet med: Aspirin - dose 81 mg daily - change to Plavix 75 mg daily. 7. NPO until RN stroke swallow screen 8. Telemetry monitoring 9. Frequent neuro checks     Hassel Neth [facility] Pager [phone] 08/18/2015, 9:53 AM

## 2015-08-18 NOTE — Progress Notes (Signed)
*  PRELIMINARY RESULTS* Vascular Ultrasound Carotid Duplex (Doppler) has been completed.  Preliminary findings: Bilateral: No significant ICA stenosis. Antegrade vertebral flow.    Farrel DemarkJill Eunice, RDMS, RVT  08/18/2015, 10:03 AM

## 2015-08-18 NOTE — Progress Notes (Addendum)
[facility] Progress Note   Chris Phelps  ZOX:[id number]  DOB: June 27, 1975  DOA: 08/17/2015 PCP: No primary care provider on file.  Brief narrative: Chris Phelps is a 40 y.o. male who has never seen a doctor developed right sided visual changes about 12 days ago. He went to an optometrist at [facility] and then came to the ER where he was found to have a BP of 267/ 149. He was given Labetalol and then started on a Labetalol infusion. MRI head was ordered which resulted in the evening suggestive of Press vs CVA. Labetalol infusion was turned off around 10 PM as BP dropped.    Subjective: Awake. Mumbles some one word responses that are non-sensical to questions. Followed commands for RN's neuro assessment but not following commands for me. Renal ultrasound being done at this time in room.  His "baby Mamma" is at the bedside. She does not live with him and states he lives with his brother. She tells me he has never had medical care in the close to 10 yrs she has known him.  Assessment/Plan: Principal Problem:   Hypertensive emergency - and abnormal MRI - discussed with Neuro this AM - Dr Lavon Paganini states likely a CVA rather than Press- start CVA work up- transfer to [facility] -- BP stable currently without medications - check UDS  Visual changes right eye x 12 days - outpt f/u with Dr Fabian Sharp discussed by Dr Catha Gosselin     Renal Failure - unable to tell if acute vs chronic - progressed since yesterday- likely not pre-renal and likely related to chronic HTN - obtaining renal ultrasound now- Addendum: renal ultrasound unrevealing- UA returned showing high specific gravity therefore suspect component of dehydration- given IVF, now has good Urine output- stop IVF   Hypokalemia - replace with oral K after he passed swallow screen-  Mg+ normal  Elevated TNI - minimally elevated- EKG suggestive of LVH again suggestive of chronic HTN-  f/u ECHO  Addendum: passed swallow screen-  will order diet Addendum: ECHO shows gr 2 diastolic dysfunction- tolerating diet-urinating well-  stop IVF  Antibiotics: Anti-infectives    None     Code Status:     Code Status Orders        Start     Ordered   08/17/15 1835  Full code   Continuous     08/17/15 1834    Code Status History    Date Active Date Inactive Code Status Order ID Comments User Context   This patient has a current code status but no historical code status.     Family Communication:   Disposition Plan: tx to [facility]- case management consult for outpt f/u, meds etc DVT prophylaxis: Lovenox Consultants: Neuro Procedures:     Objective: Filed Weights   08/17/15 2000  Weight: 81.6 kg (179 lb 14.3 oz)    Intake/Output Summary (Last 24 hours) at 08/18/15 0803 Last data filed at 08/18/15 0600  Gross per 24 hour  Intake   1150 ml  Output    340 ml  Net    810 ml     Vitals Filed Vitals:   08/18/15 0500 08/18/15 0530 08/18/15 0600 08/18/15 0700  BP: 129/88 138/93 134/79 157/98  Pulse: 58 57 56 56  Temp:      TempSrc:      Resp: Height:      Weight:      SpO2: 97% 97% 95% 95%  Exam:  General:  Pt is alert, poorly verbal, follows command for RN (not for me), not in acute distress  HEENT: No icterus, No thrush, oral mucosa moist  Cardiovascular: regular rate and rhythm, S1/S2 No murmur  Respiratory: clear to auscultation bilaterally   Abdomen: Soft, +Bowel sounds, non tender, non distended, no guarding  MSK: No cyanosis or clubbing- no pedal edema   Data Reviewed: Basic Metabolic Panel:  Recent Labs Lab 08/17/15 1443 08/17/15 1857 08/18/15 0634  NA 138  --  139  K 3.3*  --  3.1*  CL 104  --  106  CO2 23  --  25  GLUCOSE 118*  --  112*  BUN 25*  --  31*  CREATININE 1.25*  --  1.62*  CALCIUM 8.7*  --  8.2*  MG  --  2.2  --    Liver Function Tests:  Recent Labs Lab 08/18/15 0634  AST 12*  ALT 15*  ALKPHOS 42  BILITOT 0.7  PROT 6.3*   ALBUMIN 4.0   No results for input(s): LIPASE, AMYLASE in the last 168 hours. No results for input(s): AMMONIA in the last 168 hours. CBC:  Recent Labs Lab 08/17/15 1443 08/18/15 0634  WBC 12.0* 8.2  NEUTROABS 10.3*  --   HGB 14.6 12.5*  HCT 41.5 36.3*  MCV 88.9 90.3  PLT 232 206   Cardiac Enzymes:  Recent Labs Lab 08/17/15 1857 08/18/15 0052 08/18/15 0634  TROPONINI 0.04* 0.03 0.04*   BNP (last 3 results) No results for input(s): BNP in the last 8760 hours.  ProBNP (last 3 results) No results for input(s): PROBNP in the last 8760 hours.  CBG: No results for input(s): GLUCAP in the last 168 hours.  Recent Results (from the past 240 hour(s))  MRSA PCR Screening     Status: None   Collection Time: 08/17/15  6:35 PM  Result Value Ref Range Status   MRSA by PCR NEGATIVE NEGATIVE Final    Comment:        The GeneXpert MRSA Assay (FDA approved for NASAL specimens only), is one component of a comprehensive MRSA colonization surveillance program. It is not intended to diagnose MRSA infection nor to guide or monitor treatment for MRSA infections.      Studies: Dg Chest 2 View  08/17/2015  CLINICAL DATA:  Blurred vision today.  Hypertension. EXAM: CHEST  2 VIEW COMPARISON:  None. FINDINGS: Mild cardiomegaly is noted. Both lungs are clear. No evidence of pulmonary edema or consolidation. No evidence of pneumothorax or pleural effusion. IMPRESSION: Mild cardiomegaly.  No active lung disease. Electronically Signed   By: Myles RosenthalJohn  Stahl M.D.   On: 08/17/2015 17:14   Ct Head Wo Contrast  08/17/2015  CLINICAL DATA:  40 year old male with vision changes in the right eye for 11 days. Hypertensive. Initial encounter. EXAM: CT HEAD WITHOUT CONTRAST TECHNIQUE: Contiguous axial images were obtained from the base of the skull through the vertex without intravenous contrast. COMPARISON:  None. FINDINGS: No acute osseous abnormality identified. Scattered mostly small paranasal sinus  mucous retention cyst. Mastoids are clear. Orbits soft tissues appear normal. Visualized scalp soft tissues are within normal limits. Cerebral volume is normal. Multifocal abnormal hypodense areas in the cerebral white matter and basal ganglia bilaterally suggestion of a small area of cortical encephalomalacia in the right parietal lobe on series 2, image 20. No cortically based acute infarct identified. No midline shift, mass effect, or evidence of intracranial mass lesion. No acute intracranial hemorrhage identified.  No ventriculomegaly. No suspicious intracranial vascular hyperdensity. IMPRESSION: 1. Heterogeneous density in the bilateral cerebral white matter and basal ganglia is nonspecific but may indicate very age advanced small vessel disease in this setting. 2. Otherwise no acute intracranial abnormality. Electronically Signed   By: Odessa Fleming M.D.   On: 08/17/2015 14:35   Mr Brain Wo Contrast  08/17/2015  ADDENDUM REPORT: 08/17/2015 18:37 ADDENDUM: There is suggestion of mild cortical FLAIR hyperintensity in the left greater than right parietal lobes (series 7, image 19) with mildly asymmetric subcortical T2 hyperintensity on the left. This could reflect mild changes of PRES. Cerebellar and brainstem signal changes could also potentially reflect a component of PRES in addition to chronic ischemic disease. Electronically Signed   By: Sebastian Ache M.D.   On: 08/17/2015 18:37  08/17/2015  CLINICAL DATA:  Right eye visual changes for 11 days. Hypertension. Abnormal head CT. EXAM: MRI HEAD WITHOUT CONTRAST TECHNIQUE: Multiplanar, multiecho pulse sequences of the brain and surrounding structures were obtained without intravenous contrast. COMPARISON:  Head CT earlier today FINDINGS: There is a 5 mm punctate focus of mildly restricted diffusion in the right putamen consistent with acute infarct. There is also a punctate acute cortical infarct versus artifact in the high left parietal lobe. Patchy to confluent T2  hyperintensities are present in the subcortical and deep cerebral white matter bilaterally as well as in the brainstem and cerebellum. There are multiple chronic hemorrhages in the pons. Chronic microhemorrhages are also noted in the left thalamus, left parietal lobe, temporal lobes, and left cerebellum. There are chronic lacunar infarcts in the basal ganglia and deep white matter bilaterally. Patchy T2 hyperintensity is present throughout the deep gray nuclei. Ventricles and sulci are within normal limits for age. No mass, midline shift, or extra-axial fluid collection is identified. Orbits are unremarkable. Bilateral maxillary sinus mucous retention cysts are noted. Major intracranial vascular flow voids are preserved. IMPRESSION: 1. Punctate acute infarct in the right basal ganglia. 2. Punctate acute cortical infarct versus artifact in the left parietal lobe. 3. T2 signal changes throughout the cerebral white matter, deep gray nuclei, brainstem, and cerebellum with chronic hemorrhages and lacunar infarcts as above, most consistent with markedly advanced chronic small vessel ischemic disease in the setting of chronic hypertension. Electronically Signed: By: Sebastian Ache M.D. On: 08/17/2015 18:26    Scheduled Meds:  Scheduled Meds: .  stroke: mapping our early stages of recovery book   Does not apply Once  . enoxaparin (LOVENOX) injection  40 mg Subcutaneous Q24H  . potassium chloride  40 mEq Oral Q4H   Continuous Infusions: . sodium chloride 100 mL/hr at 08/18/15 1610    Time spent on care of this patient: 35 min   Berwyn Bigley, MD 08/18/2015, 8:03 AM  LOS: 1 day   [facility] Office  [phone] Pager - Text Page per [email or web address] If 7PM-7AM, please contact night-coverage [email or web address]

## 2015-08-19 DIAGNOSIS — I161 Hypertensive emergency: Secondary | ICD-10-CM

## 2015-08-19 DIAGNOSIS — I6783 Posterior reversible encephalopathy syndrome: Secondary | ICD-10-CM | POA: Diagnosis present

## 2015-08-19 DIAGNOSIS — I6330 Cerebral infarction due to thrombosis of unspecified cerebral artery: Secondary | ICD-10-CM | POA: Insufficient documentation

## 2015-08-19 LAB — BASIC METABOLIC PANEL
Anion gap: 9 (ref 5–15)
BUN: 18 mg/dL (ref 6–20)
CHLORIDE: 107 mmol/L (ref 101–111)
CO2: 23 mmol/L (ref 22–32)
CREATININE: 1.2 mg/dL (ref 0.61–1.24)
Calcium: 8.2 mg/dL — ABNORMAL LOW (ref 8.9–10.3)
GFR calc Af Amer: >60 mL/min (ref >60)
GFR calc non Af Amer: >60 mL/min (ref >60)
Glucose, Bld: 104 mg/dL — ABNORMAL HIGH (ref 65–99)
Potassium: 3.7 mmol/L (ref 3.5–5.1)
Sodium: 139 mmol/L (ref 135–145)

## 2015-08-19 LAB — CBC
HCT: 38.4 % — ABNORMAL LOW (ref 39.0–52.0)
Hemoglobin: 12.7 g/dL — ABNORMAL LOW (ref 13.0–17.0)
MCH: 30.8 pg (ref 26.0–34.0)
MCHC: 33.1 g/dL (ref 30.0–36.0)
MCV: 93 fL (ref 78.0–100.0)
PLATELETS: 199 10*3/uL (ref 150–400)
RBC: 4.13 MIL/uL — ABNORMAL LOW (ref 4.22–5.81)
RDW: 13.9 % (ref 11.5–15.5)
WBC: 8.5 10*3/uL (ref 4.0–10.5)

## 2015-08-19 LAB — HEMOGLOBIN A1C
HEMOGLOBIN A1C: 5.6 % (ref 4.8–5.6)
MEAN PLASMA GLUCOSE: 114 mg/dL

## 2015-08-19 MED ORDER — ATORVASTATIN CALCIUM 80 MG PO TABS: 80.0000 mg | ORAL_TABLET | Freq: Every day | ORAL | Status: DC

## 2015-08-19 MED ORDER — ASPIRIN EC 325 MG PO TBEC
325.0000 mg | DELAYED_RELEASE_TABLET | Freq: Every day | ORAL | Status: DC
Start: 2015-08-19 — End: 2015-08-20
  Administered 2015-08-19: 325 mg via ORAL
  Filled 2015-08-19: qty 1

## 2015-08-19 MED ORDER — LISINOPRIL 20 MG PO TABS: 20.0000 mg | ORAL_TABLET | Freq: Every day | ORAL | Status: DC

## 2015-08-19 MED ORDER — ASPIRIN 325 MG PO TBEC: 325.0000 mg | DELAYED_RELEASE_TABLET | Freq: Every day | ORAL | Status: DC

## 2015-08-19 NOTE — Progress Notes (Addendum)
[facility]  Physician Discharge Summary   Patient ID: Chris Phelps MRN: [id number] DOB/AGE: 40-Aug-1977 40 y.o.  Admit date: 08/17/2015 Discharge date: 08/19/2015  PCP: No primary care provider on file.  DISCHARGE DIAGNOSES:  Principal Problem:   Hypertensive emergency Active Problems:   Renal insufficiency   Leukocytosis   Hypokalemia   Sinus tachycardia (HCC)   Visual changes   Renal failure   Abnormal MRI   Cerebral thrombosis with cerebral infarction   PRES (posterior reversible encephalopathy syndrome)   RECOMMENDATIONS FOR OUTPATIENT FOLLOW UP: 1. Recommend checking a basic metabolic panel at follow-up 2. Outpatient follow-up with primary care, neurology and ophthalmology  DISCHARGE CONDITION: fair  Diet recommendation: Heart healthy  Filed Weights   08/17/15 2000  Weight: 81.6 kg (179 lb 14.3 oz)    INITIAL HISTORY: Chris Phelps is a 40 y.o. male who has never seen a doctor developed right sided visual changes about 12 days ago. He went to an optometrist at [facility] and then came to the ER where he was found to have a BP of 267/ 149. He was given Labetalol and then started on a Labetalol infusion. MRI head was ordered which resulted in the evening suggestive of Press vs CVA. Labetalol infusion was turned off around 10 PM as BP dropped.   Consultations:  Neurology  Procedures: Transthoracic echocardiogram Study Conclusions  - Left ventricle: The cavity size was normal. Wall thickness was increased in a pattern of moderate to severe LVH. Systolic function was normal. The estimated ejection fraction was in the range of 55% to 60%. Wall motion was normal; there were no regional wall motion abnormalities. Features are consistent with a pseudonormal left ventricular filling pattern, with concomitant abnormal relaxation and increased filling pressure (grade 2 diastolic dysfunction).  - Left atrium: The atrium was mildly to moderately  dilated.  Carotid Doppler Bilateral: No significant ICA stenosis. Antegrade vertebral flow.    HOSPITAL COURSE:   Acute stroke and PRES Patient was admitted to the hospital. He was seen by neurology. Stroke workup was initiated. MRI brain also suggested PRES. Echocardiogram and carotid Dopplers as above. Patient was placed on aspirin. LDL was elevated and he was placed on a statin. HbA1c is 5.6. TSH 1.2. Patient was seen by occupational therapy and he does not have any outpatient needs. PT evaluation is pending. He feels better, although still has some visual disturbances.  Accelerated hypertension No previous history of hypertension, although patient does not follow up with healthcare providers. He was initially initiated on a labetalol infusion, but his blood pressure dropped precipitously. Considering his stroke he will need higher blood pressures for now. This has been explained to the patient and his fiance. Blood pressure will need to be brought down slowly over 1-3 weeks. Due to presence of LVH on echocardiogram he has been started on lisinopril. Outpatient follow-up. He will need to have his potassium level as well as renal function checked at that time.   Visual changes right eye x 12 days Outpt f/u with Dr Fabian Sharp.   Mild acute Renal Failure Renal function improved. Renal ultrasound did not show any concerning changes. Small renal cyst was noted. Patient was notified of this finding. Both kidneys are equal size. Mild proteinuria as noted on UA. ACE inhibitor initiated as discussed above. Close monitoring of renal function as outpatient.  Mildly elevated troponin Significance unclear. EKG suggestive of LVH. No wall motion abnormalities noted on echocardiogram. Patient without any chest pain. Continue antiplatelet agent.  Overall,  patient is stable. Cleared by neurology. Once cleared by physical therapy can be discharged home.   PERTINENT LABS:  The results of significant  diagnostics from this hospitalization (including imaging, microbiology, ancillary and laboratory) are listed below for reference.    Microbiology: Recent Results (from the past 240 hour(s))  MRSA PCR Screening     Status: None   Collection Time: 08/17/15  6:35 PM  Result Value Ref Range Status   MRSA by PCR NEGATIVE NEGATIVE Final    Comment:        The GeneXpert MRSA Assay (FDA approved for NASAL specimens only), is one component of a comprehensive MRSA colonization surveillance program. It is not intended to diagnose MRSA infection nor to guide or monitor treatment for MRSA infections.      Labs: Basic Metabolic Panel:  Recent Labs Lab 08/17/15 1443 08/17/15 1857 08/18/15 0634 08/19/15 0440  NA 138  --  139 139  K 3.3*  --  3.1* 3.7  CL 104  --  106 107  CO2 23  --  25 23  GLUCOSE 118*  --  112* 104*  BUN 25*  --  31* 18  CREATININE 1.25*  --  1.62* 1.20  CALCIUM 8.7*  --  8.2* 8.2*  MG  --  2.2  --   --    Liver Function Tests:  Recent Labs Lab 08/18/15 0634  AST 12*  ALT 15*  ALKPHOS 42  BILITOT 0.7  PROT 6.3*  ALBUMIN 4.0   CBC:  Recent Labs Lab 08/17/15 1443 08/18/15 0634 08/19/15 0440  WBC 12.0* 8.2 8.5  NEUTROABS 10.3*  --   --   HGB 14.6 12.5* 12.7*  HCT 41.5 36.3* 38.4*  MCV 88.9 90.3 93.0  PLT 232 206 199   Cardiac Enzymes:  Recent Labs Lab 08/17/15 1857 08/18/15 0052 08/18/15 0634  TROPONINI 0.04* 0.03 0.04*    IMAGING STUDIES Ct Angio Head W/cm &/or Wo Cm  08/18/2015  CLINICAL DATA:  Stroke. Hypertension. Posterior reversible encephalopathy syndrome EXAM: CT ANGIOGRAPHY HEAD AND NECK TECHNIQUE: Multidetector CT imaging of the head and neck was performed using the standard protocol during bolus administration of intravenous contrast. Multiplanar CT image reconstructions and MIPs were obtained to evaluate the vascular anatomy. Carotid stenosis measurements (when applicable) are obtained utilizing NASCET criteria, using the distal  internal carotid diameter as the denominator. CONTRAST:  100 mL Isovue 370 IV COMPARISON:  MRI head 08/17/2015 FINDINGS: CTA NECK Aortic arch: Normal aortic arch. Proximal great vessels patent. Suboptimal arterial enhancement in the neck. This is due to timing of the scan with significant venous contrast. Right carotid system: Right carotid widely patent without significant stenosis. No dissection or atherosclerotic disease Left carotid system: Left carotid widely patent without significant stenosis or dissection. Vertebral arteries:Both vertebral arteries patent to the basilar without significant stenosis. Skeleton: Negative Other neck: Negative for mass or adenopathy in the neck. CTA HEAD Anterior circulation: Cavernous carotid widely patent bilaterally. No significant stenosis. Anterior and middle cerebral arteries patent bilaterally without stenosis. Negative for aneurysm. Posterior circulation: Both vertebral arteries patent to the basilar. PICA, superior cerebellar, and posterior cerebral arteries patent bilaterally. Negative for cerebral aneurysm. Venous sinuses: Patent Anatomic variants: None Delayed phase: Normal enhancement on delayed imaging. Chronic ischemic changes in the white matter bilaterally. IMPRESSION: Acute infarct right basal ganglia seen on recent MRI. No significant carotid or vertebral artery stenosis in the neck No significant intracranial stenosis. Electronically Signed   By: Marlan Palauharles  Clark M.D.  On: 08/18/2015 09:38   Dg Chest 2 View  08/17/2015  CLINICAL DATA:  Blurred vision today.  Hypertension. EXAM: CHEST  2 VIEW COMPARISON:  None. FINDINGS: Mild cardiomegaly is noted. Both lungs are clear. No evidence of pulmonary edema or consolidation. No evidence of pneumothorax or pleural effusion. IMPRESSION: Mild cardiomegaly.  No active lung disease. Electronically Signed   By: Myles Rosenthal M.D.   On: 08/17/2015 17:14   Ct Head Wo Contrast  08/17/2015  CLINICAL DATA:  40 year old male  with vision changes in the right eye for 11 days. Hypertensive. Initial encounter. EXAM: CT HEAD WITHOUT CONTRAST TECHNIQUE: Contiguous axial images were obtained from the base of the skull through the vertex without intravenous contrast. COMPARISON:  None. FINDINGS: No acute osseous abnormality identified. Scattered mostly small paranasal sinus mucous retention cyst. Mastoids are clear. Orbits soft tissues appear normal. Visualized scalp soft tissues are within normal limits. Cerebral volume is normal. Multifocal abnormal hypodense areas in the cerebral white matter and basal ganglia bilaterally suggestion of a small area of cortical encephalomalacia in the right parietal lobe on series 2, image 20. No cortically based acute infarct identified. No midline shift, mass effect, or evidence of intracranial mass lesion. No acute intracranial hemorrhage identified. No ventriculomegaly. No suspicious intracranial vascular hyperdensity. IMPRESSION: 1. Heterogeneous density in the bilateral cerebral white matter and basal ganglia is nonspecific but may indicate very age advanced small vessel disease in this setting. 2. Otherwise no acute intracranial abnormality. Electronically Signed   By: Odessa Fleming M.D.   On: 08/17/2015 14:35   Ct Angio Neck W/cm &/or Wo/cm  08/18/2015  CLINICAL DATA:  Stroke. Hypertension. Posterior reversible encephalopathy syndrome EXAM: CT ANGIOGRAPHY HEAD AND NECK TECHNIQUE: Multidetector CT imaging of the head and neck was performed using the standard protocol during bolus administration of intravenous contrast. Multiplanar CT image reconstructions and MIPs were obtained to evaluate the vascular anatomy. Carotid stenosis measurements (when applicable) are obtained utilizing NASCET criteria, using the distal internal carotid diameter as the denominator. CONTRAST:  100 mL Isovue 370 IV COMPARISON:  MRI head 08/17/2015 FINDINGS: CTA NECK Aortic arch: Normal aortic arch. Proximal great vessels patent.  Suboptimal arterial enhancement in the neck. This is due to timing of the scan with significant venous contrast. Right carotid system: Right carotid widely patent without significant stenosis. No dissection or atherosclerotic disease Left carotid system: Left carotid widely patent without significant stenosis or dissection. Vertebral arteries:Both vertebral arteries patent to the basilar without significant stenosis. Skeleton: Negative Other neck: Negative for mass or adenopathy in the neck. CTA HEAD Anterior circulation: Cavernous carotid widely patent bilaterally. No significant stenosis. Anterior and middle cerebral arteries patent bilaterally without stenosis. Negative for aneurysm. Posterior circulation: Both vertebral arteries patent to the basilar. PICA, superior cerebellar, and posterior cerebral arteries patent bilaterally. Negative for cerebral aneurysm. Venous sinuses: Patent Anatomic variants: None Delayed phase: Normal enhancement on delayed imaging. Chronic ischemic changes in the white matter bilaterally. IMPRESSION: Acute infarct right basal ganglia seen on recent MRI. No significant carotid or vertebral artery stenosis in the neck No significant intracranial stenosis. Electronically Signed   By: Marlan Palau M.D.   On: 08/18/2015 09:38   Mr Brain Wo Contrast  08/17/2015  ADDENDUM REPORT: 08/17/2015 18:37 ADDENDUM: There is suggestion of mild cortical FLAIR hyperintensity in the left greater than right parietal lobes (series 7, image 19) with mildly asymmetric subcortical T2 hyperintensity on the left. This could reflect mild changes of PRES. Cerebellar and brainstem signal changes  could also potentially reflect a component of PRES in addition to chronic ischemic disease. Electronically Signed   By: Sebastian Ache M.D.   On: 08/17/2015 18:37  08/17/2015  CLINICAL DATA:  Right eye visual changes for 11 days. Hypertension. Abnormal head CT. EXAM: MRI HEAD WITHOUT CONTRAST TECHNIQUE: Multiplanar,  multiecho pulse sequences of the brain and surrounding structures were obtained without intravenous contrast. COMPARISON:  Head CT earlier today FINDINGS: There is a 5 mm punctate focus of mildly restricted diffusion in the right putamen consistent with acute infarct. There is also a punctate acute cortical infarct versus artifact in the high left parietal lobe. Patchy to confluent T2 hyperintensities are present in the subcortical and deep cerebral white matter bilaterally as well as in the brainstem and cerebellum. There are multiple chronic hemorrhages in the pons. Chronic microhemorrhages are also noted in the left thalamus, left parietal lobe, temporal lobes, and left cerebellum. There are chronic lacunar infarcts in the basal ganglia and deep white matter bilaterally. Patchy T2 hyperintensity is present throughout the deep gray nuclei. Ventricles and sulci are within normal limits for age. No mass, midline shift, or extra-axial fluid collection is identified. Orbits are unremarkable. Bilateral maxillary sinus mucous retention cysts are noted. Major intracranial vascular flow voids are preserved. IMPRESSION: 1. Punctate acute infarct in the right basal ganglia. 2. Punctate acute cortical infarct versus artifact in the left parietal lobe. 3. T2 signal changes throughout the cerebral white matter, deep gray nuclei, brainstem, and cerebellum with chronic hemorrhages and lacunar infarcts as above, most consistent with markedly advanced chronic small vessel ischemic disease in the setting of chronic hypertension. Electronically Signed: By: Sebastian Ache M.D. On: 08/17/2015 18:26   US Renal  08/18/2015  CLINICAL DATA:  Acute kidney injury EXAM: RENAL / URINARY TRACT ULTRASOUND COMPLETE COMPARISON:  None. FINDINGS: Right Kidney: Length: 11.7 cm. Normal cortical thickness and echogenicity. Tiny cyst upper pole 10 x 7 x 12 mm. No additional mass, hydronephrosis or shadowing calcification. Left Kidney: Length: 11.9 cm.  Normal cortical thickness and echogenicity. No mass, hydronephrosis or shadowing calcification. Bladder: Decompressed by Foley catheter, unable to adequately assess. IMPRESSION: Tiny RIGHT renal cyst 12 mm greatest size. Otherwise negative exam. Electronically Signed   By: Ulyses Southward M.D.   On: 08/18/2015 08:35    DISCHARGE EXAMINATION: Filed Vitals:   08/19/15 0206 08/19/15 0600 08/19/15 1105 08/19/15 1527  BP: 135/87 162/92 172/103 184/103  Pulse: 70 67 69 78  Temp: 99.1 F (37.3 C) 98.8 F (37.1 C) 99.1 F (37.3 C) 99 F (37.2 C)  TempSrc: Oral Oral Oral Oral  Resp: Height:      Weight:      SpO2: 100% 99% 98% 97%   General appearance: alert, cooperative, appears stated age and no distress Resp: clear to auscultation bilaterally Cardio: regular rate and rhythm, S1, S2 normal, no murmur, click, rub or gallop GI: soft, non-tender; bowel sounds normal; no masses,  no organomegaly Extremities: extremities normal, atraumatic, no cyanosis or edema  DISPOSITION: Home with family  Discharge Instructions    Ambulatory referral to Neurology    Complete by:  As directed   An appointment is requested in approximately: 2 months     Call MD for:  difficulty breathing, headache or visual disturbances    Complete by:  As directed      Call MD for:  extreme fatigue    Complete by:  As directed      Call  MD for:  persistant dizziness or light-headedness    Complete by:  As directed      Call MD for:  persistant nausea and vomiting    Complete by:  As directed      Call MD for:  severe uncontrolled pain    Complete by:  As directed      Call MD for:  temperature >100.4    Complete by:  As directed      Diet - low sodium heart healthy    Complete by:  As directed      Discharge instructions    Complete by:  As directed   Please follow-up with outpatient providers. He will need blood work at follow-up visit including a basic metabolic panel to check his kidney function and  potassium level. He should take his medications on a regular basis. He should not drive for now. Return to work only after he has been cleared by his outpatient providers.  You were cared for by a hospitalist during your hospital stay. If you have any questions about your discharge medications or the care you received while you were in the hospital after you are discharged, you can call the unit and asked to speak with the hospitalist on call if the hospitalist that took care of you is not available. Once you are discharged, your primary care physician will handle any further medical issues. Please note that NO REFILLS for any discharge medications will be authorized once you are discharged, as it is imperative that you return to your primary care physician (or establish a relationship with a primary care physician if you do not have one) for your aftercare needs so that they can reassess your need for medications and monitor your lab values. If you do not have a primary care physician, you can call [phone] for a physician referral.     Increase activity slowly    Complete by:  As directed            ALLERGIES: No Known Allergies   Current Discharge Medication List    START taking these medications   Details  aspirin 325 MG EC tablet Take 1 tablet (325 mg total) by mouth daily. Qty: 30 tablet, Refills: 3    atorvastatin (LIPITOR) 80 MG tablet Take 1 tablet (80 mg total) by mouth daily at 6 PM. Qty: 30 tablet, Refills: 3    lisinopril (PRINIVIL,ZESTRIL) 20 MG tablet Take 1 tablet (20 mg total) by mouth daily. Qty: 30 tablet, Refills: 3      STOP taking these medications     ibuprofen (ADVIL,MOTRIN) 200 MG tablet        Follow-up Information    Follow up with [facility] On 09/06/2015.   Why:  [phone]. APPT TIME IS 2:00 PM.  Please arrive 15 min early and bring a picture ID and your current medications to the appt.    Contact information:   [address] [place] [address]       Follow up with SETHI,PRAMOD, MD. Schedule an appointment as soon as possible for a visit in 2 months.   Specialties:  Neurology, Radiology   Why:  post hospitalization follow up. office will call   Contact information:   [address] [place] [address] [phone]       Schedule an appointment as soon as possible for a visit with Fabian Sharp, MD.   Specialty:  Ophthalmology   Why:  for vision changes   Contact information:   [address] [place] [address] [phone]       TOTAL DISCHARGE TIME: 35 minutes  [facility]  [facility] Pager [phone]  08/19/2015, 3:33 PM

## 2015-08-19 NOTE — Care Management Note (Signed)
Case Management Note  Patient Details  Name: Laurice Kimmons MRN: [id number] Date of Birth: May 12, 1976  Subjective/Objective:                    Action/Plan: Patient without a PCP or insurance. CM met with the patient and his significant other and asked them about patient attending the [facility]. They were interested. CM made him an appt at the [facility] (who is seeing overflow for [facility]) and placed it on the AVS. CM also went over with the patient and his significant other the use of the [facility] pharmacy. CM will continue to follow for further d/c needs.   Expected Discharge Date:   (UNKNOWN)               Expected Discharge Plan:  Home/Self Care  In-House Referral:  NA  Discharge planning Services  CM Consult  Post Acute Care Choice:  NA Choice offered to:  NA  DME Arranged:    DME Agency:     HH Arranged:    HH Agency:     Status of Service:  Completed, signed off  Medicare Important Message Given:    Date Medicare IM Given:    Medicare IM give by:    Date Additional Medicare IM Given:    Additional Medicare Important Message give by:     If discussed at [place] of Stay Meetings, dates discussed:    Additional Comments:  Pollie Friar, RN 08/19/2015, 10:52 AM

## 2015-08-19 NOTE — Progress Notes (Signed)
Pt for discharge home today. Discharge orders received. IV and telemetry dcd with dressing clean dry and intact. Discharge instructions and 3 prescriptions given with verbalized understanding. Handouts for stroke prevention, risk factors given. Family at bedside to assist with discharge. Staff brought patient to lobby via wheelchair at 1730. Transported to home by family member.

## 2015-08-19 NOTE — Evaluation (Signed)
Occupational Therapy Evaluation Patient Details Name: Chris Phelps Deleon-Amaro MRN: [id number] DOB: 09-27-1975 Today's Date: 08/19/2015    History of Present Illness This 40 y.o. male admitted with decreased vision Rt eye.  BP on admission was 268/142.  MRI showed possible PRES vs punctate infarcts of the Rt basal ganglia and Lt parietal lobe.  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  PMH non contributory - pt has not seen medical provider in at least 10 years    Clinical Impression   Patient evaluated by Occupational Therapy with no further acute OT needs identified. All education has been completed and the patient has no further questions. Pt presents to OT with decreased vision Rt eye and subsequent decrease in depth perception.   Pt had continued to work for almost 2 weeks before seeking medical attention.  He is currently mod I with ADLs.  All education completed via interpreter line.  Recommend he does not drive for at least 2 weeks due to impaired depth perception.   Also instructed him no use of power tools.  See below for any follow-up Occupational Therapy or equipment needs. OT is signing off. Thank you for this referral.      Follow Up Recommendations  No OT follow up;Supervision - Intermittent    Equipment Recommendations  None recommended by OT    Recommendations for Other Services       Precautions / Restrictions Precautions Precautions: None      Mobility Bed Mobility Overal bed mobility: Independent                Transfers Overall transfer level: Modified independent                    Balance Overall balance assessment: No apparent balance deficits (not formally assessed)                                          ADL Overall ADL's : Modified independent                                             Vision Vision Assessment?: Yes Eye Alignment: Within Functional Limits Alignment/Gaze Preference: Within  Defined Limits Tracking/Visual Pursuits: Able to track stimulus in all quads without difficulty Visual Fields: Other (comment) (Rt eye with limited residual vision ) Additional Comments: Pt with minimal residual vision remaining Rt superior quadrant.   Pt's ex-girlfriend reports pt went to "eye doctor" the day of admission, [facility].  She reports his testing revealed Deficits were isolated to Rt eye.    Perception Perception Perception Tested?: Yes   Praxis Praxis Praxis tested?: Within functional limits    Pertinent Vitals/Pain Pain Assessment: No/denies pain     Hand Dominance Right   Extremity/Trunk Assessment Upper Extremity Assessment Upper Extremity Assessment: Overall WFL for tasks assessed   Lower Extremity Assessment Lower Extremity Assessment: Defer to PT evaluation   Cervical / Trunk Assessment Cervical / Trunk Assessment: Normal   Communication Communication Communication: Prefers language other than English   Cognition Arousal/Alertness: Awake/alert Behavior During Therapy: WFL for tasks assessed/performed Overall Cognitive Status: Within Functional Limits for tasks assessed  General Comments       Exercises       Shoulder Instructions      Home Living Family/patient expects to be discharged to:: Private residence Living Arrangements: Other relatives Available Help at Discharge: Family;Friend(s);Available 24 hours/day Type of Home: Apartment Home Access: Stairs to enter Entrance Stairs-Number of Steps: 5-6 to lower level apartment    Home Layout: One level     Bathroom Shower/Tub: Tub/shower unit Shower/tub characteristics: Engineer, building services: Standard     Home Equipment: None   Additional Comments: Pt lives with brother and co-worker.  His ex-girlfriend reports she will assist at discharge as needed       Prior Functioning/Environment Level of Independence:  Independent        Comments: Pt drives minimally.  He works full time as a Optometrist.      OT Diagnosis: Disturbance of vision   OT Problem List: Impaired vision/perception   OT Treatment/Interventions:      OT Goals(Current goals can be found in the care plan section) Acute Rehab OT Goals OT Goal Formulation: All assessment and education complete, DC therapy  OT Frequency:     Barriers to D/C:            Co-evaluation              End of Session Nurse Communication: Mobility status  Activity Tolerance: Patient tolerated treatment well Patient left: in bed;with call bell/phone within reach;with nursing/sitter in room;with family/visitor present   Time: 4010-2725 OT Time Calculation (min): 40 min Charges:  OT General Charges $OT Visit: 1 Procedure OT Evaluation $OT Eval Moderate Complexity: 1 Procedure OT Treatments $Therapeutic Activity: 23-37 mins G-Codes:    Chris Phelps Sep 16, 2015, 2:01 PM

## 2015-08-19 NOTE — Progress Notes (Signed)
SLP Cancellation Note  Patient Details Name: Chris Phelps MRN: [id number] DOB: 02/03/1976   Cancelled treatment:       Reason Eval/Treat Not Completed: Patient at procedure or test/unavailable. Unable to complete com/cog evaluation at this time, as pt is currently with PT. Will continue efforts.   Leigh AuroraBueche, [name] 08/19/2015, 12:10 PM  [name], MSP, CCC-SLP [phone]

## 2015-08-19 NOTE — Care Management Note (Signed)
Case Management Note  Patient Details  Name: Chris Phelps MRN: [id number] Date of Birth: November 07, 1975  Subjective/Objective:                    Action/Plan: Patient discharging home with self care. Pt given coupons for medications in case d/ces after hours and [facility] Bedside RN updated.   Expected Discharge Date:   (UNKNOWN)               Expected Discharge Plan:  Home/Self Care  In-House Referral:  NA  Discharge planning Services  CM Consult  Post Acute Care Choice:  NA Choice offered to:  NA  DME Arranged:    DME Agency:     HH Arranged:    HH Agency:     Status of Service:  Completed, signed off  Medicare Important Message Given:    Date Medicare IM Given:    Medicare IM give by:    Date Additional Medicare IM Given:    Additional Medicare Important Message give by:     If discussed at Long Length of Stay Meetings, dates discussed:    Additional Comments:  Chris BaloKelli F Korey Prashad, RN 08/19/2015, 4:17 PM

## 2015-08-19 NOTE — Evaluation (Signed)
Physical Therapy Evaluation & Discharge  Patient Details Name: Chris Phelps MRN: [id number] DOB: 08-28-1975 Today's Date: 08/19/2015   History of Present Illness  This 40 y.o. male admitted with decreased vision Rt eye.  BP on admission was 268/142.  MRI showed possible PRES vs punctate infarcts of the Rt basal ganglia and Lt parietal lobe.  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  PMH non contributory - pt has not seen medical provider in at least 10 years   Clinical Impression  Patient presents with decreased mobility, but continues at independent level.  Time spent with pt, ex-girlfriend and sister with phone interpreter on the line to discuss current issues as well as modifiable risk factors and warning signs of stroke.  Patient without current balance issues, but cautioned against being up on ladders as he has been for work due to in bed few days in hospitial and may need to take time prior to return to work.  No further skilled PT indicated at this time.  Noted low risk for falls per standardized balance assessments.  PT to sign off.    Follow Up Recommendations No PT follow up    Equipment Recommendations  None recommended by PT    Recommendations for Other Services       Precautions / Restrictions Precautions Precautions: None      Mobility  Bed Mobility Overal bed mobility: Independent                Transfers Overall transfer level: Independent                  Ambulation/Gait Ambulation/Gait assistance: Independent Ambulation Distance (Feet): 250 Feet Assistive device: None Gait Pattern/deviations: WFL(Within Functional Limits)        Stairs Stairs: Yes   Stair Management: One rail Right;No rails;Step to pattern;Alternating pattern;Forwards Number of Stairs: 5 General stair comments: ascended with rail and step through pattern, descended with step to pattern no rail (self selected pattern)  Wheelchair Mobility    Modified Rankin  (Stroke Patients Only) Modified Rankin (Stroke Patients Only) Pre-Morbid Rankin Score: No symptoms Modified Rankin: No significant disability     Balance Overall balance assessment: Independent                               Standardized Balance Assessment Standardized Balance Assessment : Berg Balance Test;Dynamic Gait Index Berg Balance Test Sit to Stand: Able to stand without using hands and stabilize independently Standing Unsupported: Able to stand safely 2 minutes Sitting with Back Unsupported but Feet Supported on Floor or Stool: Able to sit safely and securely 2 minutes Stand to Sit: Sits safely with minimal use of hands Transfers: Able to transfer safely, minor use of hands Standing Unsupported with Eyes Closed: Able to stand 10 seconds safely Standing Ubsupported with Feet Together: Able to place feet together independently and stand 1 minute safely From Standing, Reach Forward with Outstretched Arm: Can reach confidently >25 cm (10") From Standing Position, Pick up Object from Floor: Able to pick up shoe safely and easily From Standing Position, Turn to Look Behind Over each Shoulder: Looks behind from both sides and weight shifts well Turn 360 Degrees: Able to turn 360 degrees safely in 4 seconds or less Standing Unsupported, Alternately Place Feet on Step/Stool: Able to stand independently and safely and complete 8 steps in 20 seconds Standing Unsupported, One Foot in Front: Able to plae foot ahead of the  other independently and hold 30 seconds Standing on One Leg: Able to lift leg independently and hold 5-10 seconds Total Score: 54 Dynamic Gait Index Level Surface: Normal Change in Gait Speed: Moderate Impairment Gait with Horizontal Head Turns: Normal Gait with Vertical Head Turns: Normal Gait and Pivot Turn: Normal Step Over Obstacle: Normal Step Around Obstacles: Normal Steps: Mild Impairment Total Score: 21       Pertinent Vitals/Pain Pain  Assessment: No/denies pain    Home Living Family/patient expects to be discharged to:: Private residence Living Arrangements: Other relatives Available Help at Discharge: Family;Friend(s);Available 24 hours/day Type of Home: Apartment Home Access: Stairs to enter   Entrance Stairs-Number of Steps: 5-6 to lower level apartment  Home Layout: One level Home Equipment: None Additional Comments: Pt lives with brother and co-worker.  His ex-girlfriend reports she will assist at discharge as needed     Prior Function Level of Independence: Independent         Comments: Pt drives minimally.  He works full time as a Optometrist.       Hand Dominance        Extremity/Trunk Assessment   Upper Extremity Assessment: Overall WFL for tasks assessed           Lower Extremity Assessment: Overall WFL for tasks assessed      Cervical / Trunk Assessment: Normal  Communication   Communication: Prefers language other than English (Spanish)  Cognition Arousal/Alertness: Awake/alert Behavior During Therapy: WFL for tasks assessed/performed Overall Cognitive Status: History of cognitive impairments - at baseline (slowed processing, but girlfirend reports that is his baseline)                      General Comments General comments (skin integrity, edema, etc.): feel some deficits on DGI due to pt not understanding directions with use of phone interpreter on speaker    Exercises        Assessment/Plan    PT Assessment Patent does not need any further PT services  PT Diagnosis Abnormality of gait   PT Problem List    PT Treatment Interventions     PT Goals (Current goals can be found in the Care Plan section) Acute Rehab PT Goals PT Goal Formulation: All assessment and education complete, DC therapy    Frequency     Barriers to discharge        Co-evaluation               End of Session   Activity Tolerance: Patient tolerated treatment  well Patient left: with call bell/phone within reach;in bed;with family/visitor present           Time: 1358-1430 PT Time Calculation (min) (ACUTE ONLY): 32 min   Charges:   PT Evaluation $PT Eval Moderate Complexity: 1 Procedure PT Treatments $Neuromuscular Re-education: 8-22 mins   PT G CodesElray Mcgregor 09/18/2015, 4:49 PM  Sheran Lawless, PT [phone] 09-18-15

## 2015-08-19 NOTE — Progress Notes (Signed)
STROKE TEAM PROGRESS NOTE   HISTORY OF PRESENT ILLNESS Chris Phelps is an Hispanic 40 y.o. male who speaks limited [place]. Most of the history was obtained from the patient's chart and from his fiance who is at the bedside. The patient has had no regular medical follow-up over the past 10 years. No medications prior to admission. 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 Approximately 10 or 11 days ago the patient came home and complained of partial visual loss in his right eye. He denied headache or other symptoms; however, on further questioning he patient has had some recent fatigue, occasional pounding rapid heart rates associated with activity, and a mild dry cough.  After several days of visual problems his fiance talked to him into having his eyes examined. During that visit his blood pressure was checked and found to be abnormally high. He was instructed to come to the emergency department. On arrival his blood pressure was reportedly to 268/142. The patient was admitted and placed on a labetalol drip. An MRI as noted below revealed possible Pres versus punctate infarcts infarcts of the right basal ganglia and left parietal lobe. Currently the patient has continued visual problems with his right eye although he feels this is somewhat improved. He denies any other symptoms except for fatigue. His fiance notes that he does have a history of some stuttering speech when he is anxious or excited and she has noted some possible recent mild confusion.  His last known well is unable to be determined. Patient was not administered IV t-PA secondary to being out of window.    SUBJECTIVE (INTERVAL HISTORY) His girlfriend is at the bedside. She reports he slept for 19 hours after his BP was lowered yesterday. Today he is better but not back to baseline. He does not have  Primary MD. Girlfriend is going to help arrange at discharge. Overall he feels his condition is stable.     OBJECTIVE Temp:  [98 F (36.7 C)-99.9 F (37.7 C)] 98.8 F (37.1 C) (04/07 0600) Pulse Rate:  [52-79] 67 (04/07 0600) Cardiac Rhythm:  [-] Normal sinus rhythm (04/07 0700) Resp:  [10-22] 18 (04/07 0600) BP: (105-188)/(68-120) 162/92 mmHg (04/07 0600) SpO2:  [93 %-100 %] 99 % (04/07 0600)  CBC:  Recent Labs Lab 08/17/15 1443 08/18/15 0634 08/19/15 0440  WBC 12.0* 8.2 8.5  NEUTROABS 10.3*  --   --   HGB 14.6 12.5* 12.7*  HCT 41.5 36.3* 38.4*  MCV 88.9 90.3 93.0  PLT 232 206 199    Basic Metabolic Panel:  Recent Labs Lab 08/17/15 1857 08/18/15 0634 08/19/15 0440  NA  --  139 139  K  --  3.1* 3.7  CL  --  106 107  CO2  --  25 23  GLUCOSE  --  112* 104*  BUN  --  31* 18  CREATININE  --  1.62* 1.20  CALCIUM  --  8.2* 8.2*  MG 2.2  --   --     Lipid Panel:    Component Value Date/Time   CHOL 212* 08/18/2015 0914   TRIG 145 08/18/2015 0914   HDL 40* 08/18/2015 0914   CHOLHDL 5.3 08/18/2015 0914   VLDL 29 08/18/2015 0914   LDLCALC 143* 08/18/2015 0914   HgbA1c:  Lab Results  Component Value Date   HGBA1C 5.6 08/18/2015   Urine Drug Screen:    Component Value Date/Time   LABOPIA NONE DETECTED 08/18/2015 0923   COCAINSCRNUR NONE DETECTED  08/18/2015 0923   COCAINSCRNUR Negative 08/17/2015 1542   LABBENZ NONE DETECTED 08/18/2015 0923   AMPHETMU NONE DETECTED 08/18/2015 0923   THCU NONE DETECTED 08/18/2015 0923   LABBARB NONE DETECTED 08/18/2015 0923   LABBARB Negative 08/17/2015 1542      IMAGING  Ct Angio Head W/cm &/or Wo Cm  08/18/2015  CLINICAL DATA:  Stroke. Hypertension. Posterior reversible encephalopathy syndrome EXAM: CT ANGIOGRAPHY HEAD AND NECK TECHNIQUE: Multidetector CT imaging of the head and neck was performed using the standard protocol during bolus administration of intravenous contrast. Multiplanar CT image reconstructions and MIPs were obtained to evaluate the vascular anatomy. Carotid stenosis measurements (when applicable) are  obtained utilizing NASCET criteria, using the distal internal carotid diameter as the denominator. CONTRAST:  100 mL Isovue 370 IV COMPARISON:  MRI head 08/17/2015 FINDINGS: CTA NECK Aortic arch: Normal aortic arch. Proximal great vessels patent. Suboptimal arterial enhancement in the neck. This is due to timing of the scan with significant venous contrast. Right carotid system: Right carotid widely patent without significant stenosis. No dissection or atherosclerotic disease Left carotid system: Left carotid widely patent without significant stenosis or dissection. Vertebral arteries:Both vertebral arteries patent to the basilar without significant stenosis. Skeleton: Negative Other neck: Negative for mass or adenopathy in the neck. CTA HEAD Anterior circulation: Cavernous carotid widely patent bilaterally. No significant stenosis. Anterior and middle cerebral arteries patent bilaterally without stenosis. Negative for aneurysm. Posterior circulation: Both vertebral arteries patent to the basilar. PICA, superior cerebellar, and posterior cerebral arteries patent bilaterally. Negative for cerebral aneurysm. Venous sinuses: Patent Anatomic variants: None Delayed phase: Normal enhancement on delayed imaging. Chronic ischemic changes in the white matter bilaterally. IMPRESSION: Acute infarct right basal ganglia seen on recent MRI. No significant carotid or vertebral artery stenosis in the neck No significant intracranial stenosis. Electronically Signed   By: Marlan Palau M.D.   On: 08/18/2015 09:38   Dg Chest 2 View  08/17/2015  CLINICAL DATA:  Blurred vision today.  Hypertension. EXAM: CHEST  2 VIEW COMPARISON:  None. FINDINGS: Mild cardiomegaly is noted. Both lungs are clear. No evidence of pulmonary edema or consolidation. No evidence of pneumothorax or pleural effusion. IMPRESSION: Mild cardiomegaly.  No active lung disease. Electronically Signed   By: Myles Rosenthal M.D.   On: 08/17/2015 17:14   Ct Head Wo  Contrast  08/17/2015  CLINICAL DATA:  40 year old male with vision changes in the right eye for 11 days. Hypertensive. Initial encounter. EXAM: CT HEAD WITHOUT CONTRAST TECHNIQUE: Contiguous axial images were obtained from the base of the skull through the vertex without intravenous contrast. COMPARISON:  None. FINDINGS: No acute osseous abnormality identified. Scattered mostly small paranasal sinus mucous retention cyst. Mastoids are clear. Orbits soft tissues appear normal. Visualized scalp soft tissues are within normal limits. Cerebral volume is normal. Multifocal abnormal hypodense areas in the cerebral white matter and basal ganglia bilaterally suggestion of a small area of cortical encephalomalacia in the right parietal lobe on series 2, image 20. No cortically based acute infarct identified. No midline shift, mass effect, or evidence of intracranial mass lesion. No acute intracranial hemorrhage identified. No ventriculomegaly. No suspicious intracranial vascular hyperdensity. IMPRESSION: 1. Heterogeneous density in the bilateral cerebral white matter and basal ganglia is nonspecific but may indicate very age advanced small vessel disease in this setting. 2. Otherwise no acute intracranial abnormality. Electronically Signed   By: Odessa Fleming M.D.   On: 08/17/2015 14:35   Ct Angio Neck W/cm &/or Wo/cm  08/18/2015  CLINICAL DATA:  Stroke. Hypertension. Posterior reversible encephalopathy syndrome EXAM: CT ANGIOGRAPHY HEAD AND NECK TECHNIQUE: Multidetector CT imaging of the head and neck was performed using the standard protocol during bolus administration of intravenous contrast. Multiplanar CT image reconstructions and MIPs were obtained to evaluate the vascular anatomy. Carotid stenosis measurements (when applicable) are obtained utilizing NASCET criteria, using the distal internal carotid diameter as the denominator. CONTRAST:  100 mL Isovue 370 IV COMPARISON:  MRI head 08/17/2015 FINDINGS: CTA NECK Aortic  arch: Normal aortic arch. Proximal great vessels patent. Suboptimal arterial enhancement in the neck. This is due to timing of the scan with significant venous contrast. Right carotid system: Right carotid widely patent without significant stenosis. No dissection or atherosclerotic disease Left carotid system: Left carotid widely patent without significant stenosis or dissection. Vertebral arteries:Both vertebral arteries patent to the basilar without significant stenosis. Skeleton: Negative Other neck: Negative for mass or adenopathy in the neck. CTA HEAD Anterior circulation: Cavernous carotid widely patent bilaterally. No significant stenosis. Anterior and middle cerebral arteries patent bilaterally without stenosis. Negative for aneurysm. Posterior circulation: Both vertebral arteries patent to the basilar. PICA, superior cerebellar, and posterior cerebral arteries patent bilaterally. Negative for cerebral aneurysm. Venous sinuses: Patent Anatomic variants: None Delayed phase: Normal enhancement on delayed imaging. Chronic ischemic changes in the white matter bilaterally. IMPRESSION: Acute infarct right basal ganglia seen on recent MRI. No significant carotid or vertebral artery stenosis in the neck No significant intracranial stenosis. Electronically Signed   By: Marlan Palau M.D.   On: 08/18/2015 09:38   Mr Brain Wo Contrast  08/17/2015  ADDENDUM REPORT: 08/17/2015 18:37 ADDENDUM: There is suggestion of mild cortical FLAIR hyperintensity in the left greater than right parietal lobes (series 7, image 19) with mildly asymmetric subcortical T2 hyperintensity on the left. This could reflect mild changes of PRES. Cerebellar and brainstem signal changes could also potentially reflect a component of PRES in addition to chronic ischemic disease. Electronically Signed   By: Sebastian Ache M.D.   On: 08/17/2015 18:37  08/17/2015  CLINICAL DATA:  Right eye visual changes for 11 days. Hypertension. Abnormal head CT.  EXAM: MRI HEAD WITHOUT CONTRAST TECHNIQUE: Multiplanar, multiecho pulse sequences of the brain and surrounding structures were obtained without intravenous contrast. COMPARISON:  Head CT earlier today FINDINGS: There is a 5 mm punctate focus of mildly restricted diffusion in the right putamen consistent with acute infarct. There is also a punctate acute cortical infarct versus artifact in the high left parietal lobe. Patchy to confluent T2 hyperintensities are present in the subcortical and deep cerebral white matter bilaterally as well as in the brainstem and cerebellum. There are multiple chronic hemorrhages in the pons. Chronic microhemorrhages are also noted in the left thalamus, left parietal lobe, temporal lobes, and left cerebellum. There are chronic lacunar infarcts in the basal ganglia and deep white matter bilaterally. Patchy T2 hyperintensity is present throughout the deep gray nuclei. Ventricles and sulci are within normal limits for age. No mass, midline shift, or extra-axial fluid collection is identified. Orbits are unremarkable. Bilateral maxillary sinus mucous retention cysts are noted. Major intracranial vascular flow voids are preserved. IMPRESSION: 1. Punctate acute infarct in the right basal ganglia. 2. Punctate acute cortical infarct versus artifact in the left parietal lobe. 3. T2 signal changes throughout the cerebral white matter, deep gray nuclei, brainstem, and cerebellum with chronic hemorrhages and lacunar infarcts as above, most consistent with markedly advanced chronic small vessel ischemic disease in the setting of chronic  hypertension. Electronically Signed: By: Sebastian Ache M.D. On: 08/17/2015 18:26   US Renal  08/18/2015  CLINICAL DATA:  Acute kidney injury EXAM: RENAL / URINARY TRACT ULTRASOUND COMPLETE COMPARISON:  None. FINDINGS: Right Kidney: Length: 11.7 cm. Normal cortical thickness and echogenicity. Tiny cyst upper pole 10 x 7 x 12 mm. No additional mass, hydronephrosis or  shadowing calcification. Left Kidney: Length: 11.9 cm. Normal cortical thickness and echogenicity. No mass, hydronephrosis or shadowing calcification. Bladder: Decompressed by Foley catheter, unable to adequately assess. IMPRESSION: Tiny RIGHT renal cyst 12 mm greatest size. Otherwise negative exam. Electronically Signed   By: Ulyses Southward M.D.   On: 08/18/2015 08:35   2D Echocardiogram  - Left ventricle: The cavity size was normal. Wall thickness was increased in a pattern of moderate to severe LVH. Systolic function was normal. The estimated ejection fraction was in the range of 55% to 60%. Wall motion was normal; there were no regional wall motion abnormalities. Features are consistent with a pseudonormal left ventricular filling pattern, with concomitant abnormal relaxation and increased filling pressure (grade 2 diastolic dysfunction). - Left atrium: The atrium was mildly to moderately dilated.  Carotid Doppler   There is 1-39% bilateral ICA stenosis. Vertebral artery flow is antegrade.     PHYSICAL EXAM Pleasant middle-age Hispanic male not in distress. . Afebrile. Head is nontraumatic. Neck is supple without bruit.    Cardiac exam no murmur or gallop. Lungs are clear to auscultation. Distal pulses are well felt. Neurological Exam :   Awake  Alert oriented x 3. Normal speech and language.eye movements full without nystagmus.fundi were not visualized. Vision acuity and fields appear normal. Hearing is normal. Palatal movements are normal. Face symmetric. Tongue midline. Normal strength, tone, reflexes and coordination. Normal sensation. Gait deferred. ASSESSMENT/PLAN Mr. Juwuan Sedita is a 40 y.o. male with no significant past medical history presenting with partial vision loss, slurred speech and possible confusion. He did not receive IV t-PA due to delay in arrival.   Stroke:  punctate right basal ganglia and punctate left parietal lobe infarct secondary to small vessel disease  source  Resultant  Dysarthria  MRI  punctate right basal ganglia infarct. Questionable small left parietal lobe infarct. Severe small vessel disease. Radiologist questioned PRES. Dr. Pearlean Brownie does not feel PRES present.  CTA head no significant stenosis  CTA neck no significant stenosis  Carotid Doppler  No significant stenosis  2D Echo  Severe LVH, EF 55-60%  LDL 143  HgbA1c 5.6  Lovenox 40 mg sq daily for VTE prophylaxis  Diet Heart Room service appropriate?: Yes; Fluid consistency:: Thin  No antithrombotic prior to admission, now on clopidogrel 75 mg daily. As not on a antithrombotic prior to admission, ok to treat with aspirin 325 mg - discharge on aspirin. Orders adjusted.  Patient counseled to be compliant with his antithrombotic medications  Ongoing aggressive stroke risk factor management  Therapy recommendations:  No OT  Disposition:  pending   NOTHING FURTHER TO ADD FROM THE STROKE STANDPOINT  Patient has a 10-15% risk of having another stroke over the next year, the highest risk is within 2 weeks of the most recent stroke/TIA (risk of having a stroke following a stroke or TIA is the same).  Ongoing risk factor control by Primary Care Physician  Stroke Service will sign off. Please call should any needs arise.  Follow-up Stroke Clinic at [facility] with Dr. Delia Heady in 2 months, order placed.   Hypertension  Elevated but stable  today, within parameters  permissive hypertension (OK if < 220/120) but gradually normalize in 5-7 days  Hyperlipidemia  Home meds:  No statin  LDL 143, goal < 70  Now on lipitor 80 mg daily  Continue statin at discharge  Other Stroke Risk Factors  Obesity, Body mass index is 32.89 kg/(m^2).   Other Active Problems  Visual loss, right eye, for outpatient follow-up  Renal failure, acute versus chronic  Hypokalemia  Elevated TNI  Grade 2 diastolic dysfunction  Hospital day # 2  Rhoderick MoodyBIBY,SHARON   [facility] See Amion for Pager information 08/19/2015 2:41 PM   I have personally examined this patient, reviewed notes, independently viewed imaging studies, participated in medical decision making and plan of care. I have made any additions or clarifications directly to the above note. Agree with note above.  The patient presented with partial loss of vision in the right eye about 10 days ago but in the upcoming esophagus found to have significantly elevated blood pressure. MRI scan shows tiny lacunar infarct in right basal ganglia as well as changes of severe chronic microvascular ischemia. He remains at risk for neurological worsening, recurrent stroke, TIA needs ongoing stroke evaluation and aggressive risk factor management and blood pressure control. I had a long discussion with the patient and his girlfriend at the bedside and answered questions. Delia HeadyPramod Anna Beaird, MD Medical Director [facility] Pager: [phone] 08/19/2015 5:03 PM   To contact Stroke Continuity provider, please refer to [email or web address]. After hours, contact General Neurology

## 2015-08-19 NOTE — Discharge Instructions (Signed)
Ischemic Stroke °An ischemic stroke (cerebrovascular accident) is the sudden death of brain tissue. It is a medical emergency. An ischemic stroke can cause permanent loss of brain function. This can cause problems with different parts of your body. °CAUSES °An ischemic stroke is caused by a decrease of oxygen supply to an area of your brain. It is usually the result of a small blood clot (embolus) or collection of cholesterol or fat (plaque) that blocks blood flow in the brain. An ischemic stroke can also be caused by blocked or damaged carotid arteries. °RISK FACTORS °· High blood pressure (hypertension). °· High cholesterol. °· Diabetes mellitus. °· Heart disease. °· The buildup of plaque in the blood vessels (peripheral artery disease or atherosclerosis). °· The buildup of plaque in the blood vessels that provide blood and oxygen to the brain (carotid artery stenosis). °· An abnormal heart rhythm (atrial fibrillation). °· Obesity. °· Smoking cigarettes. °· Taking oral contraceptives, especially in combination with using tobacco. °· Physical inactivity. °· A diet that is high in fats, salt (sodium), and calories. °· Excessive alcohol use. °· Use of illegal drugs, especially cocaine and methamphetamine. °· Being African American. °· Being over the age of 55 years. °· Family history of stroke. °· Previous history of blood clots, stroke, TIA (transient ischemic attack), or heart attack. °· Sickle cell disease. °SIGNS AND SYMPTOMS °These symptoms usually develop suddenly, or you may notice them after waking up from sleep. Symptoms may include sudden: °· Weakness or numbness in your face, arm, or leg, especially on one side of your body. °· Confusion. °· Trouble speaking (aphasia) or understanding speech. °· Trouble seeing with one or both eyes. °· Trouble walking or difficulty moving your arms or legs. °· Dizziness. °· Loss of balance or coordination. °· Severe headache with no known cause. The headache is often  described as the worst headache ever experienced. °DIAGNOSIS °Your health care provider can often determine the presence or absence of an ischemic stroke based on your symptoms, history, and physical exam. CT (computed tomography) of the brain is usually performed to confirm the stroke, determine causes, and determine stroke severity. Other tests may be done to find the cause of the stroke. These tests may include: °· ECG (electrocardiogram). °· Continuous heart monitoring. °· Echocardiogram. °· Carotid ultrasound. °· MRI. °· A scan of the brain circulation. °· Blood tests. °TREATMENT °It is very important to seek treatment at the first sign of stroke symptoms. Your health care provider may perform the following treatments within 6 hours of the onset of stroke symptoms: °· Medicine to dissolve the blood clot (thrombolytic). °· Inserting a device into the affected artery to remove the blood clot. °These treatments may not be effective if too much time has passed since your stroke symptoms began. Even if you do not know when your symptoms began, get treatment as soon as possible. There are other treatment options that may be given, such as: °· Oxygen. °· IV fluids. °· Medicines to thin the blood (anticoagulants). °· A procedure to widen blocked arteries. °Your treatment will depend on how long you have had your symptoms, the severity of your symptoms, and the cause of your symptoms. °Your health care provider will take measures to prevent short-term and long-term complications of stroke, such as: °· Breathing foreign material into the lungs (aspiration pneumonia). °· Blood clots in the legs. °· Bedsores. °· Falls. °Medicines and dietary changes may be used to help treat and manage risk factors for stroke, such as   diabetes and high blood pressure. °If any of your body's functions were impaired by stroke, you may work with physical, speech, or occupational therapists to help you recover. °HOME CARE INSTRUCTIONS °· Take  medicines only as directed by your health care provider. Follow the directions carefully. Medicines may be used to control risk factors for a stroke. Be sure that you understand all your medicine instructions. °· If swallow studies have determined that your swallowing reflex is present, you should eat healthy foods. Foods may need to be a soft or pureed consistency, or you may need to take small bites in order to avoid aspirating or choking. °· Follow physical activity guidelines as directed by your health care team. °· Do not use any tobacco products, including cigarettes, chewing tobacco, or electronic cigarettes. If you smoke, quit. If you need help quitting, ask your health care provider. °· Limit or stop alcohol use. °· A safe home environment is important to reduce the risk of falls. Your health care provider may arrange for specialists to evaluate your home. Having grab bars in the bedroom and bathroom is often important. Your health care provider may arrange for equipment to be used at home, such as raised toilets and a seat for the shower. °· Ongoing physical, occupational, and speech therapy may be needed to maximize your recovery after a stroke. If you have been advised to use a walker or a cane, use it at all times. Be sure to keep your therapy appointments. °· Keep all follow-up visits with your health care provider. This is very important. This includes any referrals, therapy, rehabilitation, and lab tests. Proper follow-up can prevent another stroke from occurring. °PREVENTION °The risk of a stroke can be decreased by appropriately treating high blood pressure, high cholesterol, diabetes, heart disease, and obesity. It can also be decreased by quitting smoking, limiting alcohol, and staying physically active. °SEEK IMMEDIATE MEDICAL CARE IF: °· You have sudden weakness or numbness in your face, arm, or leg, especially on one side of your body. °· You have sudden confusion. °· You have sudden trouble  speaking (aphasia) or understanding. °· You have sudden trouble seeing with one or both eyes. °· You have sudden trouble walking or difficulty moving your arms or legs. °· You have sudden dizziness. °· You have a sudden loss of balance or coordination. °· You have a sudden, severe headache with no known cause. °· You have a partial or total loss of consciousness. °Any of these symptoms may represent a serious problem that is an emergency. Do not wait to see if the symptoms will go away. Get medical help right away. Call your local emergency services (911 in [place] Do not drive yourself to the hospital. °  °This information is not intended to replace advice given to you by your health care provider. Make sure you discuss any questions you have with your health care provider. °  °Document Released: 02/12/2014 Document Reviewed: 02/12/2014 °[employer]vier Interactive Patient Education ©2016 [employer] ° °

## 2015-08-20 NOTE — Discharge Summary (Signed)
[facility]  Physician Discharge Summary   Patient ID: Chris Phelps MRN: [id number] DOB/AGE: Jun 06, 1975 40 y.o.  Admit date: 08/17/2015 Discharge date: 08/19/2015  PCP: No primary care provider on file.  DISCHARGE DIAGNOSES:  Principal Problem:  Hypertensive emergency Active Problems:  Renal insufficiency  Leukocytosis  Hypokalemia  Sinus tachycardia (HCC)  Visual changes  Renal failure  Abnormal MRI  Cerebral thrombosis with cerebral infarction  PRES (posterior reversible encephalopathy syndrome)   RECOMMENDATIONS FOR OUTPATIENT FOLLOW UP: 1. Recommend checking a basic metabolic panel at follow-up 2. Outpatient follow-up with primary care, neurology and ophthalmology  DISCHARGE CONDITION: fair  Diet recommendation: Heart healthy  Filed Weights   08/17/15 2000  Weight: 81.6 kg (179 lb 14.3 oz)    INITIAL HISTORY: Chris Phelps is a 40 y.o. male who has never seen a doctor developed right sided visual changes about 12 days ago. [facility] and then came to the ER where he was found to have a BP of 267/ 149. He was given Labetalol and then started on a Labetalol infusion. MRI head was ordered which resulted in the evening suggestive of Press vs CVA. Labetalol infusion was turned off around 10 PM as BP dropped.   Consultations:  Neurology  Procedures: Transthoracic echocardiogram Study Conclusions  - Left ventricle: The cavity size was normal. Wall thickness was increased in a pattern of moderate to severe LVH. Systolic function was normal. The estimated ejection fraction was in the range of 55% to 60%. Wall motion was normal; there were no regional wall motion abnormalities. Features are consistent with a pseudonormal left ventricular filling pattern, with concomitant abnormal relaxation and increased filling pressure (grade 2 diastolic dysfunction).  - Left atrium: The atrium was mildly to moderately  dilated.  Carotid Doppler Bilateral: No significant ICA stenosis. Antegrade vertebral flow.    HOSPITAL COURSE:   Acute stroke and PRES Patient was admitted to the hospital. He was seen by neurology. Stroke workup was initiated. MRI brain also suggested PRES. Echocardiogram and carotid Dopplers as above. Patient was placed on aspirin. LDL was elevated and he was placed on a statin. HbA1c is 5.6. TSH 1.2. Patient was seen by occupational therapy and he does not have any outpatient needs. PT evaluation is pending. He feels better, although still has some visual disturbances.  Accelerated hypertension No previous history of hypertension, although patient does not follow up with healthcare providers. He was initially initiated on a labetalol infusion, but his blood pressure dropped precipitously. Considering his stroke he will need higher blood pressures for now. This has been explained to the patient and his fiance. Blood pressure will need to be brought down slowly over 1-3 weeks. Due to presence of LVH on echocardiogram he has been started on lisinopril. Outpatient follow-up. He will need to have his potassium level as well as renal function checked at that time.   Visual changes right eye x 12 days Outpt f/u with Dr Fabian SharpScott Groat.   Mild acute Renal Failure Renal function improved. Renal ultrasound did not show any concerning changes. Small renal cyst was noted. Patient was notified of this finding. Both kidneys are equal size. Mild proteinuria as noted on UA. ACE inhibitor initiated as discussed above. Close monitoring of renal function as outpatient.  Mildly elevated troponin Significance unclear. EKG suggestive of LVH. No wall motion abnormalities noted on echocardiogram. Patient without any chest pain. Continue antiplatelet agent.  Overall, patient is stable. Cleared by neurology. Once cleared by  physical therapy can be discharged home.   PERTINENT LABS:   The results of significant  diagnostics from this hospitalization (including imaging, microbiology, ancillary and laboratory) are listed below for reference.    Microbiology: Recent Results (from the past 240 hour(s))  MRSA PCR Screening Status: None   Collection Time: 08/17/15 6:35 PM  Result Value Ref Range Status   MRSA by PCR NEGATIVE NEGATIVE Final    Comment:   The GeneXpert MRSA Assay (FDA approved for NASAL specimens only), is one component of a comprehensive MRSA colonization surveillance program. It is not intended to diagnose MRSA infection nor to guide or monitor treatment for MRSA infections.      Labs: Basic Metabolic Panel:  Last Labs      Recent Labs Lab 08/17/15 1443 08/17/15 1857 08/18/15 0634 08/19/15 0440  NA 138 --  139 139  K 3.3* --  3.1* 3.7  CL 104 --  106 107  CO2 23 --  25 23  GLUCOSE 118* --  112* 104*  BUN 25* --  31* 18  CREATININE 1.25* --  1.62* 1.20  CALCIUM 8.7* --  8.2* 8.2*  MG --  2.2 --  --      Liver Function Tests:  Last Labs      Recent Labs Lab 08/18/15 0634  AST 12*  ALT 15*  ALKPHOS 42  BILITOT 0.7  PROT 6.3*  ALBUMIN 4.0     CBC:  Last Labs      Recent Labs Lab 08/17/15 1443 08/18/15 0634 08/19/15 0440  WBC 12.0* 8.2 8.5  NEUTROABS 10.3* --  --   HGB 14.6 12.5* 12.7*  HCT 41.5 36.3* 38.4*  MCV 88.9 90.3 93.0  PLT 232 206 199     Cardiac Enzymes:  Last Labs      Recent Labs Lab 08/17/15 1857 08/18/15 0052 08/18/15 0634  TROPONINI 0.04* 0.03 0.04*      IMAGING STUDIES  Imaging Results    Ct Angio Head W/cm &/or Wo Cm  08/18/2015 CLINICAL DATA: Stroke. Hypertension. Posterior reversible encephalopathy syndrome EXAM: CT ANGIOGRAPHY HEAD AND NECK TECHNIQUE: Multidetector CT imaging of the head and neck was performed using the standard protocol during bolus administration of  intravenous contrast. Multiplanar CT image reconstructions and MIPs were obtained to evaluate the vascular anatomy. Carotid stenosis measurements (when applicable) are obtained utilizing NASCET criteria, using the distal internal carotid diameter as the denominator. CONTRAST: 100 mL Isovue 370 IV COMPARISON: MRI head 08/17/2015 FINDINGS: CTA NECK Aortic arch: Normal aortic arch. Proximal great vessels patent. Suboptimal arterial enhancement in the neck. This is due to timing of the scan with significant venous contrast. Right carotid system: Right carotid widely patent without significant stenosis. No dissection or atherosclerotic disease Left carotid system: Left carotid widely patent without significant stenosis or dissection. Vertebral arteries:Both vertebral arteries patent to the basilar without significant stenosis. Skeleton: Negative Other neck: Negative for mass or adenopathy in the neck. CTA HEAD Anterior circulation: Cavernous carotid widely patent bilaterally. No significant stenosis. Anterior and middle cerebral arteries patent bilaterally without stenosis. Negative for aneurysm. Posterior circulation: Both vertebral arteries patent to the basilar. PICA, superior cerebellar, and posterior cerebral arteries patent bilaterally. Negative for cerebral aneurysm. Venous sinuses: Patent Anatomic variants: None Delayed phase: Normal enhancement on delayed imaging. Chronic ischemic changes in the white matter bilaterally. IMPRESSION: Acute infarct right basal ganglia seen on recent MRI. No significant carotid or vertebral artery stenosis in the neck No significant intracranial stenosis. Electronically Signed By:  Marlan Palau M.D. On: 08/18/2015 09:38   Dg Chest 2 View  08/17/2015 CLINICAL DATA: Blurred vision today. Hypertension. EXAM: CHEST 2 VIEW COMPARISON: None. FINDINGS: Mild cardiomegaly is noted. Both lungs are clear. No evidence of pulmonary edema or consolidation. No evidence of  pneumothorax or pleural effusion. IMPRESSION: Mild cardiomegaly. No active lung disease. Electronically Signed By: Myles Rosenthal M.D. On: 08/17/2015 17:14   Ct Head Wo Contrast  08/17/2015 CLINICAL DATA: 40 year old male with vision changes in the right eye for 11 days. Hypertensive. Initial encounter. EXAM: CT HEAD WITHOUT CONTRAST TECHNIQUE: Contiguous axial images were obtained from the base of the skull through the vertex without intravenous contrast. COMPARISON: None. FINDINGS: No acute osseous abnormality identified. Scattered mostly small paranasal sinus mucous retention cyst. Mastoids are clear. Orbits soft tissues appear normal. Visualized scalp soft tissues are within normal limits. Cerebral volume is normal. Multifocal abnormal hypodense areas in the cerebral white matter and basal ganglia bilaterally suggestion of a small area of cortical encephalomalacia in the right parietal lobe on series 2, image 20. No cortically based acute infarct identified. No midline shift, mass effect, or evidence of intracranial mass lesion. No acute intracranial hemorrhage identified. No ventriculomegaly. No suspicious intracranial vascular hyperdensity. IMPRESSION: 1. Heterogeneous density in the bilateral cerebral white matter and basal ganglia is nonspecific but may indicate very age advanced small vessel disease in this setting. 2. Otherwise no acute intracranial abnormality. Electronically Signed By: Odessa Fleming M.D. On: 08/17/2015 14:35   Ct Angio Neck W/cm &/or Wo/cm  08/18/2015 CLINICAL DATA: Stroke. Hypertension. Posterior reversible encephalopathy syndrome EXAM: CT ANGIOGRAPHY HEAD AND NECK TECHNIQUE: Multidetector CT imaging of the head and neck was performed using the standard protocol during bolus administration of intravenous contrast. Multiplanar CT image reconstructions and MIPs were obtained to evaluate the vascular anatomy. Carotid stenosis measurements (when applicable) are obtained utilizing  NASCET criteria, using the distal internal carotid diameter as the denominator. CONTRAST: 100 mL Isovue 370 IV COMPARISON: MRI head 08/17/2015 FINDINGS: CTA NECK Aortic arch: Normal aortic arch. Proximal great vessels patent. Suboptimal arterial enhancement in the neck. This is due to timing of the scan with significant venous contrast. Right carotid system: Right carotid widely patent without significant stenosis. No dissection or atherosclerotic disease Left carotid system: Left carotid widely patent without significant stenosis or dissection. Vertebral arteries:Both vertebral arteries patent to the basilar without significant stenosis. Skeleton: Negative Other neck: Negative for mass or adenopathy in the neck. CTA HEAD Anterior circulation: Cavernous carotid widely patent bilaterally. No significant stenosis. Anterior and middle cerebral arteries patent bilaterally without stenosis. Negative for aneurysm. Posterior circulation: Both vertebral arteries patent to the basilar. PICA, superior cerebellar, and posterior cerebral arteries patent bilaterally. Negative for cerebral aneurysm. Venous sinuses: Patent Anatomic variants: None Delayed phase: Normal enhancement on delayed imaging. Chronic ischemic changes in the white matter bilaterally. IMPRESSION: Acute infarct right basal ganglia seen on recent MRI. No significant carotid or vertebral artery stenosis in the neck No significant intracranial stenosis. Electronically Signed By: Marlan Palau M.D. On: 08/18/2015 09:38   Mr Brain Wo Contrast  08/17/2015 ADDENDUM REPORT: 08/17/2015 18:37 ADDENDUM: There is suggestion of mild cortical FLAIR hyperintensity in the left greater than right parietal lobes (series 7, image 19) with mildly asymmetric subcortical T2 hyperintensity on the left. This could reflect mild changes of PRES. Cerebellar and brainstem signal changes could also potentially reflect a component of PRES in addition to chronic ischemic disease.  Electronically Signed By: Sebastian Ache M.D. On: 08/17/2015 18:37  08/17/2015  CLINICAL DATA: Right eye visual changes for 11 days. Hypertension. Abnormal head CT. EXAM: MRI HEAD WITHOUT CONTRAST TECHNIQUE: Multiplanar, multiecho pulse sequences of the brain and surrounding structures were obtained without intravenous contrast. COMPARISON: Head CT earlier today FINDINGS: There is a 5 mm punctate focus of mildly restricted diffusion in the right putamen consistent with acute infarct. There is also a punctate acute cortical infarct versus artifact in the high left parietal lobe. Patchy to confluent T2 hyperintensities are present in the subcortical and deep cerebral white matter bilaterally as well as in the brainstem and cerebellum. There are multiple chronic hemorrhages in the pons. Chronic microhemorrhages are also noted in the left thalamus, left parietal lobe, temporal lobes, and left cerebellum. There are chronic lacunar infarcts in the basal ganglia and deep white matter bilaterally. Patchy T2 hyperintensity is present throughout the deep gray nuclei. Ventricles and sulci are within normal limits for age. No mass, midline shift, or extra-axial fluid collection is identified. Orbits are unremarkable. Bilateral maxillary sinus mucous retention cysts are noted. Major intracranial vascular flow voids are preserved. IMPRESSION: 1. Punctate acute infarct in the right basal ganglia. 2. Punctate acute cortical infarct versus artifact in the left parietal lobe. 3. T2 signal changes throughout the cerebral white matter, deep gray nuclei, brainstem, and cerebellum with chronic hemorrhages and lacunar infarcts as above, most consistent with markedly advanced chronic small vessel ischemic disease in the setting of chronic hypertension. Electronically Signed: By: Sebastian Ache M.D. On: 08/17/2015 18:26   US Renal  08/18/2015 CLINICAL DATA: Acute kidney injury EXAM: RENAL / URINARY TRACT ULTRASOUND COMPLETE  COMPARISON: None. FINDINGS: Right Kidney: Length: 11.7 cm. Normal cortical thickness and echogenicity. Tiny cyst upper pole 10 x 7 x 12 mm. No additional mass, hydronephrosis or shadowing calcification. Left Kidney: Length: 11.9 cm. Normal cortical thickness and echogenicity. No mass, hydronephrosis or shadowing calcification. Bladder: Decompressed by Foley catheter, unable to adequately assess. IMPRESSION: Tiny RIGHT renal cyst 12 mm greatest size. Otherwise negative exam. Electronically Signed By: Ulyses Southward M.D. On: 08/18/2015 08:35     DISCHARGE EXAMINATION: Filed Vitals:   08/19/15 0206 08/19/15 0600 08/19/15 1105 08/19/15 1527  BP: 135/87 162/92 172/103 184/103  Pulse: 70 67 69 78  Temp: 99.1 F (37.3 C) 98.8 F (37.1 C) 99.1 F (37.3 C) 99 F (37.2 C)  TempSrc: Oral Oral Oral Oral  Resp: Height:      Weight:      SpO2: 100% 99% 98% 97%   General appearance: alert, cooperative, appears stated age and no distress Resp: clear to auscultation bilaterally Cardio: regular rate and rhythm, S1, S2 normal, no murmur, click, rub or gallop GI: soft, non-tender; bowel sounds normal; no masses, no organomegaly Extremities: extremities normal, atraumatic, no cyanosis or edema  DISPOSITION: Home with family  Discharge Instructions    Ambulatory referral to Neurology  Complete by: As directed   An appointment is requested in approximately: 2 months     Call MD for: difficulty breathing, headache or visual disturbances  Complete by: As directed      Call MD for: extreme fatigue  Complete by: As directed      Call MD for: persistant dizziness or light-headedness  Complete by: As directed      Call MD for: persistant nausea and vomiting  Complete by: As directed      Call MD for: severe uncontrolled pain  Complete by: As directed      Call MD for: temperature  >  100.4  Complete by: As directed      Diet - low sodium heart healthy  Complete by: As directed      Discharge instructions  Complete by: As directed   Please follow-up with outpatient providers. He will need blood work at follow-up visit including a basic metabolic panel to check his kidney function and potassium level. He should take his medications on a regular basis. He should not drive for now. Return to work only after he has been cleared by his outpatient providers.  You were cared for by a hospitalist during your hospital stay. If you have any questions about your discharge medications or the care you received while you were in the hospital after you are discharged, you can call the unit and asked to speak with the hospitalist on call if the hospitalist that took care of you is not available. Once you are discharged, your primary care physician will handle any further medical issues. Please note that NO REFILLS for any discharge medications will be authorized once you are discharged, as it is imperative that you return to your primary care physician (or establish a relationship with a primary care physician if you do not have one) for your aftercare needs so that they can reassess your need for medications and monitor your lab values. If you do not have a primary care physician, you can call [phone] for a physician referral.     Increase activity slowly  Complete by: As directed            ALLERGIES: No Known Allergies   Current Discharge Medication List    START taking these medications   Details  aspirin 325 MG EC tablet Take 1 tablet (325 mg total) by mouth daily. Qty: 30 tablet, Refills: 3    atorvastatin (LIPITOR) 80 MG tablet Take 1 tablet (80 mg total) by mouth daily at 6 PM. Qty: 30 tablet, Refills: 3    lisinopril (PRINIVIL,ZESTRIL) 20 MG tablet Take 1 tablet (20 mg total) by mouth daily. Qty: 30 tablet, Refills: 3        STOP taking these medications     ibuprofen (ADVIL,MOTRIN) 200 MG tablet        Follow-up Information    Follow up with [facility] On 09/06/2015.   Why: [phone]. APPT TIME IS 2:00 PM. Please arrive 15 min early and bring a picture ID and your current medications to the appt.    Contact information:   [address] [place] [address]       Follow up with SETHI,PRAMOD, MD. Schedule an appointment as soon as possible for a visit in 2 months.   Specialties: Neurology, Radiology   Why: post hospitalization follow up. office will call   Contact information:   [address] [place] [address] [phone]       Schedule an appointment as soon as possible for a visit with Fabian Sharp, MD.   Specialty: Ophthalmology   Why: for vision changes   Contact information:   [address] [place] [address] [phone]       TOTAL DISCHARGE TIME: 35 minutes  [facility]  [facility] Pager [phone]  08/19/2015, 3:33 PM

## 2015-09-06 ENCOUNTER — Ambulatory Visit (INDEPENDENT_AMBULATORY_CARE_PROVIDER_SITE_OTHER): Payer: Self-pay | Admitting: Family Medicine

## 2015-09-06 ENCOUNTER — Encounter: Payer: Self-pay | Admitting: Family Medicine

## 2015-09-06 VITALS — BP 178/96 | HR 74 | Temp 98.9°F | Resp 16 | Ht 64.0 in | Wt 164.0 lb

## 2015-09-06 DIAGNOSIS — E876 Hypokalemia: Secondary | ICD-10-CM

## 2015-09-06 DIAGNOSIS — I160 Hypertensive urgency: Secondary | ICD-10-CM | POA: Insufficient documentation

## 2015-09-06 DIAGNOSIS — Z23 Encounter for immunization: Secondary | ICD-10-CM

## 2015-09-06 DIAGNOSIS — Z8673 Personal history of transient ischemic attack (TIA), and cerebral infarction without residual deficits: Secondary | ICD-10-CM

## 2015-09-06 DIAGNOSIS — E785 Hyperlipidemia, unspecified: Secondary | ICD-10-CM | POA: Insufficient documentation

## 2015-09-06 DIAGNOSIS — Z789 Other specified health status: Secondary | ICD-10-CM

## 2015-09-06 LAB — POCT URINALYSIS DIP (DEVICE)
BILIRUBIN URINE: NEGATIVE
Glucose, UA: NEGATIVE mg/dL
KETONES UR: NEGATIVE mg/dL
LEUKOCYTES UA: NEGATIVE
Nitrite: NEGATIVE
PH: 7 (ref 5.0–8.0)
Protein, ur: 30 mg/dL — AB
Specific Gravity, Urine: 1.02 (ref 1.005–1.030)
Urobilinogen, UA: 0.2 mg/dL (ref 0.0–1.0)

## 2015-09-06 MED ORDER — ATORVASTATIN CALCIUM 80 MG PO TABS: 80.0000 mg | ORAL_TABLET | Freq: Every day | ORAL | Status: DC

## 2015-09-06 MED ORDER — ASPIRIN 325 MG PO TBEC
325.0000 mg | DELAYED_RELEASE_TABLET | Freq: Every day | ORAL | Status: DC
Start: 2015-09-06 — End: 2016-03-27

## 2015-09-06 MED ORDER — AMLODIPINE BESYLATE 5 MG PO TABS: 5.0000 mg | ORAL_TABLET | Freq: Every day | ORAL | Status: DC

## 2015-09-06 MED ORDER — CLONIDINE HCL 0.1 MG PO TABS
0.2000 mg | ORAL_TABLET | Freq: Once | ORAL | Status: AC
  Administered 2015-09-06: 0.2 mg via ORAL

## 2015-09-06 MED FILL — ?AMLODIPINE BESYLATE 5 MG T: 5 | 30 days supply | Qty: 30 | Fill #0

## 2015-09-06 NOTE — Progress Notes (Signed)
Subjective:    Patient ID: Chris Phelps, male    DOB: 10-26-1975, 40 y.o.   MRN: [id number]  HPI  Chris Phelps, a 40 year old male that presents accompanied by wife. Patient primarily speaks spanish. Using video interpreter to assist with communication. Patient was recently admitted to hospital inpatient services after having high blood pressure at opthalmology office. Patient had MRA, which was suggestive of a CVA. Patient had partial visual loss in his right eye. MRI revealed possible pres versus punctate infarcts of the right basal ganglia and left parietal lobe. He currently denies dizziness, headache, he reports that vision in right eye has improved. Blood pressure was markedly elevated on arrival. He says that he has been taking Lisinopril 20 mg consistently. He is not exercising or following a low sodium diet. He is on daily ASA and statin therapy. He has not been checking blood pressures at home. Patient denies chest pain, claudication, dyspnea, fatigue, irregular heart beat, near-syncope, orthopnea, palpitations and tachypnea.  Cardiovascular risk factors include: male gender and sedentary lifestyle.  Past Medical History  Diagnosis Date  . Medical history non-contributory   No Known Allergies  Social History   Social History  . Marital Status: Single    Spouse Name: N/A  . Number of Children: N/A  . Years of Education: N/A   Occupational History  . Not on file.   Social History Main Topics  . Smoking status: Never Smoker   . Smokeless tobacco: Never Used  . Alcohol Use: No  . Drug Use: No     Comment: past marijuana use x 2 years ago  . Sexual Activity: Yes   Other Topics Concern  . Not on file   Social History Narrative   There is no immunization history on file for this patient.   Review of Systems  Constitutional: Negative.  Negative for fever and fatigue.  HENT: Negative.   Eyes: Negative.  Negative for photophobia, redness and visual disturbance.   Respiratory: Negative.  Negative for shortness of breath and stridor.   Cardiovascular: Negative.   Gastrointestinal: Negative.   Endocrine: Negative.  Negative for polydipsia, polyphagia and polyuria.  Genitourinary: Negative.   Musculoskeletal: Negative.   Skin: Negative.   Allergic/Immunologic: Negative.  Negative for immunocompromised state.  Neurological: Negative.  Negative for dizziness and facial asymmetry.  Hematological: Negative.   Psychiatric/Behavioral: Negative.        Objective:   Physical Exam  Constitutional: He is oriented to person, place, and time. He appears well-developed and well-nourished.  HENT:  Head: Normocephalic and atraumatic.  Right Ear: External ear normal.  Left Ear: External ear normal.  Nose: Nose normal.  Mouth/Throat: Oropharynx is clear and moist.  Eyes: Conjunctivae and EOM are normal. Pupils are equal, round, and reactive to light.  Neck: Normal range of motion. Neck supple.  Cardiovascular: Normal rate, regular rhythm, normal heart sounds and intact distal pulses.   Pulmonary/Chest: Effort normal and breath sounds normal.  Abdominal: Soft. Bowel sounds are normal.  Musculoskeletal: Normal range of motion.  Neurological: He is alert and oriented to person, place, and time. He has normal reflexes.  Skin: Skin is warm and dry.  Psychiatric: He has a normal mood and affect. His behavior is normal. Judgment and thought content normal.       BP 210/118 mmHg  Pulse 74  Temp(Src) 98.9 F (37.2 C) (Oral)  Resp 16  Ht  (1.626 m)  Wt 164 lb (74.39 kg)  BMI  28.14 kg/m2  SpO2 100% Assessment & Plan:  1. Hypertensive urgency Blood pressure was markedly elevated on arrival. Patient was given Clonidine 0.3 mg in office, bp decreased to 178/96. Will also started on a trial of Norvasc 5 mg daily. He will follow up in office for a bp check.  - amLODipine (NORVASC) 5 MG tablet; Take 1 tablet (5 mg total) by mouth daily.  Dispense: 30 tablet;  Refill: 5 - cloNIDine (CATAPRES) tablet 0.2 mg; Take 2 tablets (0.2 mg total) by mouth once. - POCT urinalysis dipstick - POCT urinalysis dip (device) - Microalbumin/Creatinine Ratio, Urine  2. History of CVA (cerebrovascular accident)  Follow up with Dr. Pearlean BrownieSethi, neurologist on 10/03/2015 as scheduled  3. Dyslipidemia - atorvastatin (LIPITOR) 80 MG tablet; Take 1 tablet (80 mg total) by mouth daily at 6 PM.  Dispense: 30 tablet; Refill: 5 - aspirin 325 MG EC tablet; Take 1 tablet (325 mg total) by mouth daily.  Dispense: 30 tablet; Refill: 5  4. Hypokalemia - BASIC METABOLIC PANEL WITH GFR  5. Need for Tdap vaccination - Tdap vaccine greater than or equal to 7yo IM  6. Language barrier to communication Used video interpreter to assist with communication.     RTC: 1 week for bp check and 1 month for hypertension   The patient was given clear instructions to go to ER or return to medical center if symptoms do not improve, worsen or new problems develop. The patient verbalized understanding. Will notify patient with laboratory results. Massie MaroonHollis,Ruhee Enck M, FNP

## 2015-09-06 NOTE — Patient Instructions (Signed)
Plan de alimentacin DASH (DASH Eating Plan) DASH es la sigla en ingls de "Enfoques Alimentarios para Detener la Hipertensin". El plan de alimentacin DASH ha demostrado bajar la presin arterial elevada (hipertensin). Los beneficios adicionales para la salud pueden incluir la disminucin del riesgo de diabetes mellitus tipo2, enfermedades cardacas e ictus. Este plan tambin puede ayudar a Horticulturist, commercial. QU DEBO SABER ACERCA DEL PLAN DE ALIMENTACIN DASH? Para el plan de alimentacin DASH, seguir las siguientes pautas generales:  Elija los alimentos con un valor porcentual diario de sodio de menos del 5% (segn figura en la etiqueta del alimento).  Use hierbas o aderezos sin sal, en lugar de sal de mesa o sal marina.  Consulte al mdico o farmacutico antes de usar sustitutos de la sal.  Coma productos con bajo contenido de sodio, cuya etiqueta suele decir "bajo contenido de sodio" o "sin agregado de sal".  Coma alimentos frescos.  Coma ms verduras, frutas y productos lcteos con bajo contenido de [place].  Elija los cereales integrales. Busque la palabra "integral" en Equities trader de la lista de ingredientes.  Elija el pescado y el pollo o el pavo sin piel ms a menudo que las carnes rojas. Limite el consumo de pescado, carne de ave y carne a 6onzas (170g) por Training and development officer.  Limite el consumo de dulces, postres, azcares y bebidas azucaradas.  Elija las grasas saludables para el corazn.  Limite el consumo de queso a 1onza (28g) por Training and development officer.  Consuma ms comida casera y menos de restaurante, de buf y comida rpida.  Limite el consumo de alimentos fritos.  Cocine los alimentos utilizando mtodos que no sean la fritura.  Limite las verduras enlatadas. Si las consume, enjuguelas bien para disminuir el sodio.  Cuando coma en un restaurante, pida que preparen su comida con menos sal o, en lo posible, sin nada de sal. QU ALIMENTOS PUEDO COMER? Pida ayuda a un nutricionista para  conocer las necesidades calricas individuales. Cereales Pan de salvado o integral. Arroz integral. Pastas de salvado o integrales. Quinua, trigo burgol y cereales integrales. Cereales con bajo contenido de sodio. Tortillas de harina de maz o de salvado. Pan de maz integral. Galletas saladas integrales. Galletas con bajo contenido de [place]. Vegetales Verduras frescas o congeladas (crudas, al vapor, asadas o grilladas). Jugos de tomate y verduras con contenido bajo o reducido de sodio. Pasta y salsa de tomate con contenido bajo o [place]. Verduras enlatadas con bajo contenido de sodio o reducido de sodio.  Lambert Mody Lambert Mody frescas, en conserva (en su jugo natural) o frutas congeladas. Carnes y otros productos con protenas Carne de res molida (al 85% o ms [place]), carne de res de animales alimentados con pastos o carne de res sin la grasa. Pollo o pavo sin piel. Carne de pollo o de [place]. Cerdo sin la grasa. Todos los pescados y frutos de mar. Huevos. Porotos, guisantes o lentejas secos. Frutos secos y semillas sin sal. Frijoles enlatados sin sal. Lcteos Productos lcteos con bajo contenido de grasas, como [place] o al 1%, quesos reducidos en grasas o al 2%, ricota con bajo contenido de grasas o [employer], o yogur natural con bajo contenido de [place]. Quesos con contenido bajo o reducido de sodio. Grasas y Naval architect en barra que no contengan grasas trans. Mayonesa y alios para ensaladas livianos o reducidos en grasas (reducidos en sodio). [place]. Aceites de crtamo, oliva o canola. Mantequilla natural de man o almendra. Otros Palomitas de maz y pretzels sin sal.  Los artculos mencionados arriba pueden no ser [employer]on de las bebidas o los alimentos recomendados. Comunquese con el nutricionista para conocer ms opciones. QU ALIMENTOS NO SE RECOMIENDAN? Cereales Pan blanco. Pastas blancas. Arroz blanco. Pan de maz refinado. Bagels y  croissants. Galletas saladas que contengan grasas trans. Vegetales Vegetales con crema o fritos. Verduras en salsa de Christopher. Verduras enlatadas comunes. Pasta y salsa de tomate en lata comunes. Jugos comunes de tomate y de verduras. Nils Pyle Frutas secas. Fruta enlatada en almbar liviano o espeso. Jugo de frutas. Carnes y otros productos con protenas Cortes de carne con Holiday representative. Costillas, alas de pollo, tocineta, salchicha, mortadela, salame, chinchulines, tocino, perros calientes, salchichas alemanas y embutidos envasados. Frutos secos y semillas con sal. Frijoles con sal en lata. Lcteos Leche entera o al 2%, crema, mezcla de [place] y crema, y queso crema. Yogur entero o endulzado. Quesos o queso azul con alto contenido de Neurosurgeon. Cremas no lcteas y coberturas batidas. Quesos procesados, quesos para untar o cuajadas. Condimentos Sal de cebolla y ajo, sal condimentada, sal de mesa y sal marina. Salsas en lata y envasadas. Salsa [place]. Salsa trtara. Salsa barbacoa. Salsa teriyaki. Salsa de soja, incluso la que tiene contenido reducido de [employer]. Salsa de carne. Salsa de pescado. Salsa de [place]. Salsa rosada. Rbano picante. Ketchup y mostaza. Saborizantes y tiernizantes para carne. Caldo en cubitos. Salsa picante. Salsa tabasco. Adobos. Aderezos para tacos. Salsas. Grasas y [address], [place] en barra, [place] de [place], [place], [place] clarificada y Steffanie Rainwater de tocino. Aceites de coco, de palmiste o de palma. Aderezos comunes para ensalada. Otros Pickles y [place]. Palomitas de maz y pretzels con sal. Los artculos mencionados arriba pueden no ser [employer]theon de las bebidas y los alimentos que se Theatre stage manager. Comunquese con el nutricionista para obtener ms informacin. DNDE Raelyn Mora MS INFORMACIN? Instituto Nacional del [place], del Pulmn y de Risk manager (National Heart, Lung, and Blood Institute): CablePromo.it    Esta informacin no tiene Theme park manager el consejo del mdico. Asegrese de hacerle al mdico cualquier pregunta que tenga.   Document Released: 04/19/2011 Document Revised: 05/21/2014 Elsevier Interactive Patient Education 2016 [employer]. [place] su presin arterial (Managing Your High Blood Pressure) La presin arterial es la medida de la fuerza de la sangre al presionar contra las paredes de las arterias. Las arterias son tubos musculares que estn dentro del sistema circulatorio. La presin arterial no es constante. Se eleva con la actividad, la excitacin o el nerviosismo y disminuye durante el sueo y Facilities manager. Si los valores de medicin de la presin arterial se mantienen por arriba de lo normal por [employer], hay riesgo de [address] La presin arterial alta (hipertensin) es una enfermedad de larga duracin (crnica) en la que la presin arterial est elevada.  La lectura de la presin arterial se registra con dos nmeros, por ejemplo 120 sobre 80 (o 120/80). El primer nmero, el ms alto, es la presin sistlica. Es la medida de la presin de las arterias cuando el corazn late. El segundo nmero, el ms bajo, es la presin diastlica. Es la medida de la presin en las arterias cuando el corazn se relaja entre latidos.  Es importante Photographer presin arterial en un rango normal para Personal assistant en general y otros problemas de salud, como enfermedades del corazn e ictus. Cuando no se controla la presin arterial, el corazn trabaja ms de lo normal. La hipertensin arterial es una enfermedad muy comn  en los adultos debido a que tiende a aumentar con Publixla edad. Hombres y mujeres son igualmente propensos a tener hipertensin, pero en diferentes momentos de la vida. Antes de los 45 aos, los hombres son ms propensos a sufrir hipertensin. Despus de 65 aos de edad, las mujeres tienen ms probabilidades de padecerla. La hipertensin es Weyerhaeuser Companymuy comn en los  afroamericanos. Esta enfermedad generalmente no manifiesta signos ni sntomas. Generalmente se desconoce la causa. El mdico podr indicarle un plan para mantener la presin arterial en un rango normal y saludable.  ETAPAS DE PRESIN ARTERIAL La presin arterial se clasifica en cuatro etapas: normal, prehipertensin, etapa 1 y etapa 2. Se puede leer la presin arterial para determinar qu tipo de tratamiento, si se indicara, es necesario. Las opciones apropiadas para el tratamiento estn vinculadas a estas cuatro etapas:  Normal   Presin sistlica (mm Hg): por debajo de 120.  Presin diastlica (mm Hg): por debajo de 80. Prehipertensin   Presin sistlica (mm Hg): 120 a 139.  Presin diastlica (mm Hg): 80 a 89. Etapa1   Presin sistlica (mm Hg): 140 a 159.  Presin diastlica (mm Hg): 90 a 99. Etapa2   Presin sistlica (mm Hg): 160 o ms.  Presin diastlica (mm Hg): 100 o ms. RIESGOS RELACIONADOS CON LA PRESIN ARTERIAL ALTA Controlar la presin arterial es una responsabilidad importante. La hipertensin no controlada puede llevar a:   Ataques cardacos.  Ictus.  Debilitamiento de los vasos sanguneos (aneurisma).  Insuficiencia cardaca.  Dao renal.  Dao ocular.  Sndrome metablico.  Problemas de memoria y concentracin. CMO CONTROLAR LA PRESIN ARTERIAL La presin arterial puede ser controlada efectivamente con cambios en el estilo de vida y de medicamentos (si es necesario). El Firefightermdico le indicar un plan para bajar la presin arterial al rango normal. Su plan debera incluir lo siguiente:  Educacin   Lea toda la informacin proporcionada por sus mdicos acerca de cmo controlar la presin arterial.  Infrmese sobre las ltimas recomendaciones de pautas y Emersontratamiento. Continuamente se hacen nuevas investigaciones para definir con ms precisin los riesgos y los tratamientos para la hipertensin arterial. Cambiosen el estilo de vida  Control del  [place] Townpeso.  No fumar.  Mantenerse fsicamente activo.  Disminuir la cantidad de sal de la dieta.  Reducir las situaciones de estrs.  Controlar las enfermedades crnicas, como el colesterol alto o la diabetes.  Reducir el consumo de alcohol. Medicamentos  Estn disponibles diferentes medicamentos (medicamentos antihipertensivos) para que la presin arterial quede dentro de un rango normal. Comunicacin   Revise con su mdico todos los medicamentos que toma ya que puede haber efectos secundarios o interacciones.  Hable con su mdico acerca de la dieta, hbitos de ejercicio y otros factores del estilo de vida que pueden contribuir a la hipertensin arterial.  OceanographerConcurra regularmente a la consulta con el profesional. El mdico puede ayudarle a crear y Dawayne Patriciaajustar su plan para controlar la presin arterial alta. RECOMENDACIONES PARA EL TRATAMIENTO Y CONTROL  Las siguientes recomendaciones se basan en las pautas actuales para controlar la hipertensin arterial en adultos no gestantes. Utilice estas recomendaciones para determinar el perodo de seguimiento adecuado o la opcin de tratamiento basada en la lectura de su presin arterial. Podr conversar sobre estas opciones con su mdico.   Presin sistlica de 120 a 139 o presin diastlica de 80 a 89: Concurra a las visitas de control, segn las indicaciones.  Presin sistlica de 140 a 160 o presin diastlica de 90 a 100: Haga una visita  de control con el profesional dentro de [employer].  Presin sistlica por arriba de 160 o presin diastlica por arriba de 100: Haga una visita de control con el profesional dentro de un mes.  Presin sistlica por arriba de 180 o presin diastlica por arriba de 110: Considere la posibilidad de seguir una terapia antihipertensiva; concurra a una visita de control con su mdico dentro de 1 semana.  Presin sistlica por arriba de 200 o presin diastlica por arriba de 120: Comience el tratamiento antihipertensivo;  concurra una visita de control con su mdico dentro de 1 semana.   Esta informacin no tiene Theme park manager el consejo del mdico. Asegrese de hacerle al mdico cualquier pregunta que tenga.   Document Released: 01/23/2012 Elsevier Interactive Patient Education [employer]

## 2015-09-07 LAB — BASIC METABOLIC PANEL WITH GFR
BUN: 17 mg/dL (ref 7–25)
CALCIUM: 8.8 mg/dL (ref 8.6–10.3)
CO2: 27 mmol/L (ref 20–31)
Chloride: 104 mmol/L (ref 98–110)
Creat: 1.27 mg/dL (ref 0.60–1.35)
GFR, EST AFRICAN AMERICAN: 82 mL/min (ref >=60)
GFR, EST NON AFRICAN AMERICAN: 71 mL/min (ref >=60)
GLUCOSE: 110 mg/dL — AB (ref 65–99)
Potassium: 4.1 mmol/L (ref 3.5–5.3)
SODIUM: 140 mmol/L (ref 135–146)

## 2015-09-07 LAB — MICROALBUMIN / CREATININE URINE RATIO
Creatinine, Urine: 149 mg/dL (ref 20–370)
MICROALB UR: 10.9 mg/dL
MICROALB/CREAT RATIO: 73 ug/mg{creat} — AB (ref <30)

## 2015-09-15 MED FILL — ?LISINOPRIL 20 MG TABLET: 20 | 30 days supply | Qty: 30 | Fill #0

## 2015-09-15 MED FILL — ATORVASTATIN 80 MG TABLET: 80 | 30 days supply | Qty: 30 | Fill #0

## 2015-09-29 MED FILL — AMLODIPINE BESYLATE 5 MG TA: 5 | 30 days supply | Qty: 30 | Fill #1

## 2015-10-03 ENCOUNTER — Ambulatory Visit (INDEPENDENT_AMBULATORY_CARE_PROVIDER_SITE_OTHER): Payer: Self-pay | Admitting: Neurology

## 2015-10-03 ENCOUNTER — Encounter: Payer: Self-pay | Admitting: Neurology

## 2015-10-03 VITALS — BP 201/120 | HR 85 | Ht 64.0 in | Wt 161.4 lb

## 2015-10-03 DIAGNOSIS — H53131 Sudden visual loss, right eye: Secondary | ICD-10-CM

## 2015-10-03 DIAGNOSIS — H53139 Sudden visual loss, unspecified eye: Secondary | ICD-10-CM | POA: Insufficient documentation

## 2015-10-03 NOTE — Patient Instructions (Signed)
I had a long d/w patient , girlfriend via spanish language interpreterabout his recent stroke, risk for recurrent stroke/TIAs, personally independently reviewed imaging studies and stroke evaluation results and answered questions.Continue aspirin 325 mg daily  for secondary stroke prevention and maintain strict control of hypertension with blood pressure goal below 130/90, and lipids with LDL cholesterol goal below 70 mg/dL. I also advised the patient to eat a healthy diet with plenty of whole grains, cereals, fruits and vegetables, exercise regularly and maintain ideal body weight .Followup in the future with me stroke NP in 6 months or call earlier if necessary. Plan de alimentacin DASH (DASH Eating Plan) DASH es la sigla en ingls de "Enfoques Alimentarios para Detener la Hipertensin". El plan de alimentacin DASH ha demostrado bajar la presin arterial elevada (hipertensin). Los beneficios adicionales para la salud pueden incluir la disminucin del riesgo de diabetes mellitus tipo2, enfermedades cardacas e ictus. Este plan tambin puede ayudar a Geophysical data processor. QU DEBO SABER ACERCA DEL PLAN DE ALIMENTACIN DASH? Para el plan de alimentacin DASH, seguir las siguientes pautas generales:  Elija los alimentos con un valor porcentual diario de sodio de menos del 5% (segn figura en la etiqueta del alimento).  Use hierbas o aderezos sin sal, en lugar de sal de mesa o sal marina.  Consulte al mdico o farmacutico antes de usar sustitutos de la sal.  Coma productos con bajo contenido de sodio, cuya etiqueta suele decir "bajo contenido de sodio" o "sin agregado de sal".  Coma alimentos frescos.  Coma ms verduras, frutas y productos lcteos con bajo contenido de [place].  Elija los cereales integrales. Busque la palabra "integral" en Estate agent de la lista de ingredientes.  Elija el pescado y el pollo o el pavo sin piel ms a menudo que las carnes rojas. Limite el consumo de pescado, carne  de ave y carne a 6onzas (170g) por Futures trader.  Limite el consumo de dulces, postres, azcares y bebidas azucaradas.  Elija las grasas saludables para el corazn.  Limite el consumo de queso a 1onza (28g) por Futures trader.  Consuma ms comida casera y menos de restaurante, de buf y comida rpida.  Limite el consumo de alimentos fritos.  Cocine los alimentos utilizando mtodos que no sean la fritura.  Limite las verduras enlatadas. Si las consume, enjuguelas bien para disminuir el sodio.  Cuando coma en un restaurante, pida que preparen su comida con menos sal o, en lo posible, sin nada de sal. QU ALIMENTOS PUEDO COMER? Pida ayuda a un nutricionista para conocer las necesidades calricas individuales. Cereales Pan de salvado o integral. Arroz integral. Pastas de salvado o integrales. Quinua, trigo burgol y cereales integrales. Cereales con bajo contenido de sodio. Tortillas de harina de maz o de salvado. Pan de maz integral. Galletas saladas integrales. Galletas con bajo contenido de [place]. Vegetales Verduras frescas o congeladas (crudas, al vapor, asadas o grilladas). Jugos de tomate y verduras con contenido bajo o reducido de sodio. Pasta y salsa de tomate con contenido bajo o reducido de sodio. Verduras enlatadas con bajo contenido de sodio o reducido de sodio.  Nils Pyle Nils Pyle frescas, en conserva (en su jugo natural) o frutas congeladas. Carnes y otros productos con protenas Carne de res molida (al 85% o ms [place]), carne de res de animales alimentados con pastos o carne de res sin la grasa. Pollo o pavo sin piel. Carne de pollo o de [place]. Cerdo sin la grasa. Todos los pescados y frutos de mar. Huevos. Porotos, guisantes o  lentejas secos. Frutos secos y semillas sin sal. Frijoles enlatados sin sal. Lcteos Productos lcteos con bajo contenido de grasas, como [place] descremada o al 1%, quesos reducidos en grasas o al 2%, ricota con bajo contenido de grasas o [employer]gett & Plattqueso cottage, o yogur  natural con bajo contenido de Vandivergrasas. Quesos con contenido bajo o reducido de sodio. Grasas y Writeraceites Margarinas en barra que no contengan grasas trans. [place]onesa y alios para ensaladas livianos o reducidos en grasas (reducidos en sodio). [place]. Aceites de crtamo, oliva o canola. Mantequilla natural de man o almendra. Otros Palomitas de maz y pretzels sin sal. Los artculos mencionados arriba pueden no ser Raytheonuna lista completa de las bebidas o los alimentos recomendados. Comunquese con el nutricionista para conocer ms opciones. QU ALIMENTOS NO SE RECOMIENDAN? Cereales Pan blanco. Pastas blancas. Arroz blanco. Pan de maz refinado. Bagels y croissants. Galletas saladas que contengan grasas trans. Vegetales Vegetales con crema o fritos. Verduras en salsa de [place]. Verduras enlatadas comunes. Pasta y salsa de tomate en lata comunes. Jugos comunes de tomate y de verduras. Nils PyleFrutas Frutas secas. Fruta enlatada en almbar liviano o espeso. Jugo de frutas. Carnes y otros productos con protenas Cortes de carne con Holiday representativegrasa. Costillas, alas de pollo, tocineta, salchicha, mortadela, salame, chinchulines, tocino, perros calientes, salchichas alemanas y embutidos envasados. Frutos secos y semillas con sal. Frijoles con sal en lata. Lcteos Leche entera o al 2%, crema, mezcla de [place] y crema, y queso crema. Yogur entero o endulzado. Quesos o queso azul con alto contenido de Neurosurgeongrasas. Cremas no lcteas y coberturas batidas. Quesos procesados, quesos para untar o cuajadas. Condimentos Sal de cebolla y ajo, sal condimentada, sal de mesa y sal marina. Salsas en lata y envasadas. Salsa [place]. Salsa trtara. Salsa barbacoa. Salsa teriyaki. Salsa de soja, incluso la que tiene contenido reducido de [place] Eurekasodio. Salsa de carne. Salsa de pescado. Salsa de [place]ostras. Salsa rosada. Rbano picante. Ketchup y mostaza. Saborizantes y tiernizantes para carne. Caldo en cubitos. Salsa picante. Salsa tabasco. Adobos. Aderezos  para tacos. Salsas. Grasas y [address] [place], Indiamargarina en barra, Breaux Bridgemanteca de [place]cerdo, [place]rasa, Singaporemantequilla clarificada y Steffanie Rainwatergrasa de tocino. Aceites de coco, de palmiste o de palma. Aderezos comunes para ensalada. Otros Pickles y Georgeaceitunas. Palomitas de maz y pretzels con sal. Los artculos mencionados arriba pueden no ser Raytheonuna lista completa de las bebidas y los alimentos que se Theatre stage managerdeben evitar. Comunquese con el nutricionista para obtener ms informacin. DNDE Raelyn MoraPUEDO ENCONTRAR MS INFORMACIN? Instituto Nacional del [place], del Pulmn y de Risk managerla [place] (National Heart, Lung, and Blood Institute): CablePromo.itwww.nhlbi.nih.gov/health/health-topics/topics/dash/   Esta informacin no tiene Theme park managercomo fin reemplazar el consejo del mdico. Asegrese de hacerle al mdico cualquier pregunta que tenga.   Document Released: 04/19/2011 Document Revised: 05/21/2014 Elsevier Interactive Patient Education [employer] Inc2016 Elsevier Inc.  Stroke Prevention Some medical conditions and behaviors are associated with an increased chance of having a stroke. You may prevent a stroke by making healthy choices and managing medical conditions. HOW CAN I REDUCE MY RISK OF HAVING A STROKE?   Stay physically active. Get at least 30 minutes of activity on most or all days.  Do not smoke. It may also be helpful to avoid exposure to secondhand smoke.  Limit alcohol use. Moderate alcohol use is considered to be:  No more than 2 drinks per day for men.  No more than 1 drink per day for nonpregnant women.  Eat healthy foods. This involves:  Eating 5 or more servings of fruits and vegetables a day.  Making  dietary changes that address high blood pressure (hypertension), high cholesterol, diabetes, or obesity.  Manage your cholesterol levels.  Making food choices that are high in fiber and low in saturated fat, trans fat, and cholesterol may control cholesterol levels.  Take any prescribed medicines to control cholesterol as directed by your  health care provider.  Manage your diabetes.  Controlling your carbohydrate and sugar intake is recommended to manage diabetes.  Take any prescribed medicines to control diabetes as directed by your health care provider.  Control your hypertension.  Making food choices that are low in salt (sodium), saturated fat, trans fat, and cholesterol is recommended to manage hypertension.  Ask your health care provider if you need treatment to lower your blood pressure. Take any prescribed medicines to control hypertension as directed by your health care provider.  If you are 70-68 years of age, have your blood pressure checked every 3-5 years. If you are 76 years of age or older, have your blood pressure checked every year.  Maintain a healthy weight.  Reducing calorie intake and making food choices that are low in sodium, saturated fat, trans fat, and cholesterol are recommended to manage weight.  Stop drug abuse.  Avoid taking birth control pills.  Talk to your health care provider about the risks of taking birth control pills if you are over 27 years old, smoke, get migraines, or have ever had a blood clot.  Get evaluated for sleep disorders (sleep apnea).  Talk to your health care provider about getting a sleep evaluation if you snore a lot or have excessive sleepiness.  Take medicines only as directed by your health care provider.  For some people, aspirin or blood thinners (anticoagulants) are helpful in reducing the risk of forming abnormal blood clots that can lead to stroke. If you have the irregular heart rhythm of atrial fibrillation, you should be on a blood thinner unless there is a good reason you cannot take them.  Understand all your medicine instructions.  Make sure that other conditions (such as anemia or atherosclerosis) are addressed. SEEK IMMEDIATE MEDICAL CARE IF:   You have sudden weakness or numbness of the face, arm, or leg, especially on one side of the  body.  Your face or eyelid droops to one side.  You have sudden confusion.  You have trouble speaking (aphasia) or understanding.  You have sudden trouble seeing in one or both eyes.  You have sudden trouble walking.  You have dizziness.  You have a loss of balance or coordination.  You have a sudden, severe headache with no known cause.  You have new chest pain or an irregular heartbeat. Any of these symptoms may represent a serious problem that is an emergency. Do not wait to see if the symptoms will go away. Get medical help at once. Call your local emergency services (911 in [place] Do not drive yourself to the hospital.   This information is not intended to replace advice given to you by your health care provider. Make sure you discuss any questions you have with your health care provider.   Document Released: 06/07/2004 Document Revised: 05/21/2014 Document Reviewed: 10/31/2012 Elsevier Interactive Patient Education [employer] Inc.

## 2015-10-03 NOTE — Progress Notes (Signed)
[facility] [address] [place]. [place] [address] [phone]       OFFICE FOLLOW-UP NOTE  [name] Date of Birth:  1976-02-07 Medical Record Number:  [id number]   HPI: 89 year  Hispanic male seen today for first office follow-up visit following hospital admission for hypertensive emergency and lacunar infarcts in April 2017. He is accompanied by his fiancee and Spanish language interpreter.Chris Phelps is an Hispanic 40 y.o. male who speaks limited [place]. Most of the history was obtained from the patient's chart and from his fiance who is at the bedside. The patient has had no regular medical follow-up over the past 10 years. No medications prior to admission. 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 Approximately 10 or 11 days ago the patient came home and complained of partial visual loss in his right eye. He denied headache or other symptoms; however, on further questioning he patient has had some recent fatigue, occasional pounding rapid heart rates associated with activity, and a mild dry cough. After several days of visual problems his fiance talked to him into having his eyes examined. During that visit his blood pressure was checked and found to be abnormally high. He was instructed to come to the emergency department. On arrival his blood pressure was reportedly to 268/142. The patient was admitted and placed on a labetalol drip. An MRI as noted below revealed possible Pres versus punctate infarcts infarcts of the right basal ganglia and left parietal lobe. Currently the patient has continued visual problems with his right eye although he feels this is somewhat improved. He denies any other symptoms except for fatigue. His fiance notes that he does have a history of some stuttering speech when he is anxious or excited and she has noted some possible recent mild confusion. His last known well is unable to be determined. Patient was  not administered IV t-PA secondary to being out of window.  MRI scan of the brain showed tiny punctate right basal ganglia and left parietal white matter infarcts which were felt to be due to small vessel disease. CT angiogram of the brain and neck poor did not show any significant large vessel stenosis. Carotid ultrasound showed no significant extracranial stenosis. Transthoracic echo showed severe left ventricular hypertrophy with ejection fraction of 55-60%. LDL cholesterol was elevated at 140 mg percent and hemoglobin A1c was 5.6. Patient was started on aspirin for stroke prevention and Norvasc and Prinivil for hypertension and Lipitor for elevated lipids. Patient's states he continues to have right eye vision loss and can only barely see in the upper quadrant. He has seen Dr. Dione Booze for follow-up of her vision is not improved. His tolerate aspirin well without bleeding or bruising. His remains on Lipitor and denies any muscle aches or pains. His blood pressure is yet elevated and today it is 201/120 in office. He has seen his primary care physician and has an appointment later this week as well to discuss blood pressure management. He has been able to drive but has to be careful. His back to work and not having any major problems. He has cut back the salt intake in his diet. ROS:   14 system review of systems is positive for loss of vision only and all other systems negative PMH:  Past Medical History  Diagnosis Date  . Medical history non-contributory   . Stroke (HCC)   . Hypertension   . Hyperlipemia     Social History:  Social History  Social History  . Marital Status: Single    Spouse Name: N/A  . Number of Children: N/A  . Years of Education: N/A   Occupational History  . Not on file.   Social History Main Topics  . Smoking status: Never Smoker   . Smokeless tobacco: Never Used  . Alcohol Use: 0.6 oz/week    1 Cans of beer per week     Comment: social beer  . Drug Use: No      Comment: past marijuana use x 2 years ago  . Sexual Activity: Yes   Other Topics Concern  . Not on file   Social History Narrative    Medications:   Current Outpatient Prescriptions on File Prior to Visit  Medication Sig Dispense Refill  . amLODipine (NORVASC) 5 MG tablet Take 1 tablet (5 mg total) by mouth daily. 30 tablet 5  . aspirin 325 MG EC tablet Take 1 tablet (325 mg total) by mouth daily. 30 tablet 5  . atorvastatin (LIPITOR) 80 MG tablet Take 1 tablet (80 mg total) by mouth daily at 6 PM. 30 tablet 5  . lisinopril (PRINIVIL,ZESTRIL) 20 MG tablet Take 1 tablet (20 mg total) by mouth daily. 30 tablet 3   No current facility-administered medications on file prior to visit.    Allergies:  No Known Allergies  Physical Exam General: well developed, well nourished young hispanic male., seated, in no evident distress Head: head normocephalic and atraumatic.  Neck: supple with no carotid or supraclavicular bruits Cardiovascular: regular rate and rhythm, no murmurs Musculoskeletal: no deformity Skin:  no rash/petichiae Vascular:  Normal pulses all extremities Filed Vitals:   10/03/15 1549  BP: 201/120  Pulse: 85   Neurologic Exam Mental Status: Awake and fully alert. Oriented to place and time. Recent and remote memory intact. Attention span, concentration and fund of knowledge appropriate. Mood and affect appropriate.  Cranial Nerves: Fundoscopic exam reveals sharp disc margins. Pupils equal, briskly reactive to light. Extraocular movements full without nystagmus. Visual fields full to confrontation in left eye and has only partial vision in right eye superior nasal quadrant.Marland Kitchen Hearing intact. Facial sensation intact. Face, tongue, palate moves normally and symmetrically.  Motor: Normal bulk and tone. Normal strength in all tested extremity muscles. Sensory.: intact to touch ,pinprick .position and vibratory sensation.  Coordination: Rapid alternating movements normal in all  extremities. Finger-to-nose and heel-to-shin performed accurately bilaterally. Gait and Station: Arises from chair without difficulty. Stance is normal. Gait demonstrates normal stride length and balance . Able to heel, toe and tandem walk without difficulty.  Reflexes: 1+ and symmetric. Toes downgoing.   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 I also advised the patient to eat a healthy diet with plenty of whole grains, cereals, fruits and vegetables, exercise regularly and maintain ideal body weight Greater than 50% of time during this 25 minute visit was spent on counseling,explanation of diagnosis, planning of further management, discussion with patient  and family and coordination of care .Marland Kitchen.Followup in the future with me stroke NP in 6 months or call earlier if necessary. Delia HeadyPramod Sethi, MD    Note: This document was prepared with digital dictation and possible smart phrase technology. Any transcriptional errors that result from this process are unintentional

## 2015-10-06 ENCOUNTER — Ambulatory Visit (INDEPENDENT_AMBULATORY_CARE_PROVIDER_SITE_OTHER): Payer: Self-pay | Admitting: Family Medicine

## 2015-10-06 ENCOUNTER — Encounter: Payer: Self-pay | Admitting: Family Medicine

## 2015-10-06 VITALS — BP 174/102 | HR 68 | Temp 98.7°F | Resp 16 | Wt 159.0 lb

## 2015-10-06 DIAGNOSIS — E785 Hyperlipidemia, unspecified: Secondary | ICD-10-CM

## 2015-10-06 DIAGNOSIS — Z8673 Personal history of transient ischemic attack (TIA), and cerebral infarction without residual deficits: Secondary | ICD-10-CM

## 2015-10-06 DIAGNOSIS — I160 Hypertensive urgency: Secondary | ICD-10-CM

## 2015-10-06 DIAGNOSIS — Z789 Other specified health status: Secondary | ICD-10-CM | POA: Insufficient documentation

## 2015-10-06 LAB — CBC WITH DIFFERENTIAL/PLATELET
BASOS ABS: 0 {cells}/uL (ref 0–200)
Basophils Relative: 0 %
EOS PCT: 4 %
Eosinophils Absolute: 280 cells/uL (ref 15–500)
HEMATOCRIT: 37.8 % — AB (ref 38.5–50.0)
HEMOGLOBIN: 13 g/dL — AB (ref 13.2–17.1)
LYMPHS ABS: 1470 {cells}/uL (ref 850–3900)
Lymphocytes Relative: 21 %
MCH: 31.3 pg (ref 27.0–33.0)
MCHC: 34.4 g/dL (ref 32.0–36.0)
MCV: 90.9 fL (ref 80.0–100.0)
MONO ABS: 420 {cells}/uL (ref 200–950)
MPV: 9.5 fL (ref 7.5–12.5)
Monocytes Relative: 6 %
NEUTROS ABS: 4830 {cells}/uL (ref 1500–7800)
Neutrophils Relative %: 69 %
Platelets: 261 10*3/uL (ref 140–400)
RBC: 4.16 MIL/uL — AB (ref 4.20–5.80)
RDW: 13.7 % (ref 11.0–15.0)
WBC: 7 10*3/uL (ref 3.8–10.8)

## 2015-10-06 LAB — TSH: TSH: 0.81 m[IU]/L (ref 0.40–4.50)

## 2015-10-06 LAB — POCT URINALYSIS DIP (DEVICE)
BILIRUBIN URINE: NEGATIVE
GLUCOSE, UA: NEGATIVE mg/dL
Hgb urine dipstick: NEGATIVE
KETONES UR: NEGATIVE mg/dL
LEUKOCYTES UA: NEGATIVE
Nitrite: NEGATIVE
Protein, ur: 30 mg/dL — AB
Specific Gravity, Urine: 1.015 (ref 1.005–1.030)
Urobilinogen, UA: 0.2 mg/dL (ref 0.0–1.0)
pH: 5.5 (ref 5.0–8.0)

## 2015-10-06 MED ORDER — LISINOPRIL 30 MG PO TABS: 30.0000 mg | ORAL_TABLET | Freq: Every day | ORAL | Status: DC

## 2015-10-06 MED ORDER — CLONIDINE HCL 0.1 MG PO TABS
0.1000 mg | ORAL_TABLET | Freq: Once | ORAL | Status: AC
  Administered 2015-10-06: 0.1 mg via ORAL

## 2015-10-06 MED ORDER — AMLODIPINE BESYLATE 10 MG PO TABS: 10.0000 mg | ORAL_TABLET | Freq: Every day | ORAL | Status: DC

## 2015-10-06 NOTE — Progress Notes (Signed)
Subjective:    Patient ID: Chris Phelps, male    DOB: 1975-10-24, 40 y.o.   MRN: [id number]  HPI  [name], a 40 year old male that presents accompanied by wife. Patient primarily speaks spanish. Using video interpreter to assist with communication.Blood pressure was markedly elevated on arrival. He says that he has been taking Lisinopril 20 mg consistently. He is not exercising or following a low sodium diet. He is on daily ASA and statin therapy. He has not been checking blood pressures at home. Patient denies chest pain, claudication, dyspnea, fatigue, irregular heart beat, near-syncope, orthopnea, palpitations and tachypnea.  Cardiovascular risk factors include: male gender and sedentary lifestyle.  Past Medical History  Diagnosis Date  . Medical history non-contributory   . Stroke (HCC)   . Hypertension   . Hyperlipemia   No Known Allergies  Social History   Social History  . Marital Status: Single    Spouse Name: N/A  . Number of Children: N/A  . Years of Education: N/A   Occupational History  . Not on file.   Social History Main Topics  . Smoking status: Never Smoker   . Smokeless tobacco: Never Used  . Alcohol Use: 0.6 oz/week    1 Cans of beer per week     Comment: social beer  . Drug Use: No     Comment: past marijuana use x 2 years ago  . Sexual Activity: Yes   Other Topics Concern  . Not on file   Social History Narrative   Immunization History  Administered Date(s) Administered  . Tdap 09/06/2015     Review of Systems  Constitutional: Negative.  Negative for fever and fatigue.  HENT: Negative.   Eyes: Negative.  Negative for photophobia, redness and visual disturbance.  Respiratory: Negative.  Negative for shortness of breath and stridor.   Cardiovascular: Negative.   Gastrointestinal: Negative.   Endocrine: Negative.  Negative for cold intolerance, heat intolerance, polydipsia, polyphagia and polyuria.  Genitourinary: Negative.    Musculoskeletal: Negative.   Skin: Negative.   Allergic/Immunologic: Negative.  Negative for immunocompromised state.  Neurological: Negative.  Negative for dizziness and facial asymmetry.  Hematological: Negative.   Psychiatric/Behavioral: Negative.        Objective:   Physical Exam  Constitutional: He is oriented to person, place, and time. He appears well-developed and well-nourished.  HENT:  Head: Normocephalic and atraumatic.  Right Ear: External ear normal.  Left Ear: External ear normal.  Nose: Nose normal.  Mouth/Throat: Oropharynx is clear and moist.  Eyes: Conjunctivae and EOM are normal. Pupils are equal, round, and reactive to light.  Neck: Normal range of motion. Neck supple.  Cardiovascular: Normal rate, regular rhythm, normal heart sounds and intact distal pulses.   Pulmonary/Chest: Effort normal and breath sounds normal.  Abdominal: Soft. Bowel sounds are normal.  Musculoskeletal: Normal range of motion.  Neurological: He is alert and oriented to person, place, and time. He has normal reflexes.  Skin: Skin is warm and dry.  Psychiatric: He has a normal mood and affect. His behavior is normal. Judgment and thought content normal.       BP 174/102 mmHg  Pulse 68  Temp(Src) 98.7 F (37.1 C) (Oral)  Resp 16  Wt 159 lb (72.122 kg)  SpO2 100% Assessment & Plan:   1. Hypertensive urgency  Blood pressure is markedly elevated. Patient was given Clonidine 0.1 mg prior to discharge. Will increase Amlodipine to 1 mg daily and Lisinopril to 30  mg daily.   - amLODipine (NORVASC) 10 MG tablet; Take 1 tablet (10 mg total) by mouth daily.  Dispense: 30 tablet; Refill: 5 - lisinopril (PRINIVIL,ZESTRIL) 30 MG tablet; Take 1 tablet (30 mg total) by mouth daily.  Dispense: 30 tablet; Refill: 5 - cloNIDine (CATAPRES) tablet 0.1 mg; Take 1 tablet (0.1 mg total) by mouth once. - TSH - CBC with Differential  2. History of CVA (cerebrovascular accident)  Follow up with Dr.  Pearlean BrownieSethi, neurologist as scheduled  3. Dyslipidemia Continue statin and ASA therapy as previously prescribed.   4. Language barrier to communication Used video interpreter to assist with communication.     RTC: 1 week for bp check and 1 month for hypertension   The patient was given clear instructions to go to ER or return to medical center if symptoms do not improve, worsen or new problems develop. The patient verbalized understanding. Will notify patient with laboratory results. Massie MaroonHollis,Tashae Inda M, FNP

## 2015-10-06 NOTE — Patient Instructions (Signed)
DASH Eating Plan  DASH stands for "Dietary Approaches to Stop Hypertension." The DASH eating plan is a healthy eating plan that has been shown to reduce high blood pressure (hypertension). Additional health benefits may include reducing the risk of type 2 diabetes mellitus, heart disease, and stroke. The DASH eating plan may also help with weight loss.  WHAT DO I NEED TO KNOW ABOUT THE DASH EATING PLAN?  For the DASH eating plan, you will follow these general guidelines:  · Choose foods with a percent daily value for sodium of less than 5% (as listed on the food label).  · Use salt-free seasonings or herbs instead of table salt or sea salt.  · Check with your health care provider or pharmacist before using salt substitutes.  · Eat lower-sodium products, often labeled as "lower sodium" or "no salt added."  · Eat fresh foods.  · Eat more vegetables, fruits, and low-fat dairy products.  · Choose whole grains. Look for the word "whole" as the first word in the ingredient list.  · Choose fish and skinless chicken or turkey more often than red meat. Limit fish, poultry, and meat to 6 oz (170 g) each day.  · Limit sweets, desserts, sugars, and sugary drinks.  · Choose heart-healthy fats.  · Limit cheese to 1 oz (28 g) per day.  · Eat more home-cooked food and less restaurant, buffet, and fast food.  · Limit fried foods.  · Cook foods using methods other than frying.  · Limit canned vegetables. If you do use them, rinse them well to decrease the sodium.  · When eating at a restaurant, ask that your food be prepared with less salt, or no salt if possible.  WHAT FOODS CAN I EAT?  Seek help from a dietitian for individual calorie needs.  Grains  Whole grain or whole wheat bread. Brown rice. Whole grain or whole wheat pasta. Quinoa, bulgur, and whole grain cereals. Low-sodium cereals. Corn or whole wheat flour tortillas. Whole grain cornbread. Whole grain crackers. Low-sodium crackers.  Vegetables  Fresh or frozen vegetables  (raw, steamed, roasted, or grilled). Low-sodium or reduced-sodium tomato and vegetable juices. Low-sodium or reduced-sodium tomato sauce and paste. Low-sodium or reduced-sodium canned vegetables.   Fruits  All fresh, canned (in natural juice), or frozen fruits.  Meat and Other Protein Products  Ground beef (85% or leaner), grass-fed beef, or beef trimmed of fat. Skinless chicken or turkey. Ground chicken or turkey. Pork trimmed of fat. All fish and seafood. Eggs. Dried beans, peas, or lentils. Unsalted nuts and seeds. Unsalted canned beans.  Dairy  Low-fat dairy products, such as skim or 1% milk, 2% or reduced-fat cheeses, low-fat ricotta or cottage cheese, or plain low-fat yogurt. Low-sodium or reduced-sodium cheeses.  Fats and Oils  Tub margarines without trans fats. Light or reduced-fat mayonnaise and salad dressings (reduced sodium). Avocado. Safflower, olive, or canola oils. Natural peanut or almond butter.  Other  Unsalted popcorn and pretzels.  The items listed above may not be a complete list of recommended foods or beverages. Contact your dietitian for more options.  WHAT FOODS ARE NOT RECOMMENDED?  Grains  White bread. White pasta. White rice. Refined cornbread. Bagels and croissants. Crackers that contain trans fat.  Vegetables  Creamed or fried vegetables. Vegetables in a cheese sauce. Regular canned vegetables. Regular canned tomato sauce and paste. Regular tomato and vegetable juices.  Fruits  Dried fruits. Canned fruit in light or heavy syrup. Fruit juice.  Meat and Other Protein   Products  Fatty cuts of meat. Ribs, chicken wings, bacon, sausage, bologna, salami, chitterlings, fatback, hot dogs, bratwurst, and packaged luncheon meats. Salted nuts and seeds. Canned beans with salt.  Dairy  Whole or 2% milk, cream, half-and-half, and cream cheese. Whole-fat or sweetened yogurt. Full-fat cheeses or blue cheese. Nondairy creamers and whipped toppings. Processed cheese, cheese spreads, or cheese  curds.  Condiments  Onion and garlic salt, seasoned salt, table salt, and sea salt. Canned and packaged gravies. Worcestershire sauce. Tartar sauce. Barbecue sauce. Teriyaki sauce. Soy sauce, including reduced sodium. Steak sauce. Fish sauce. Oyster sauce. Cocktail sauce. Horseradish. Ketchup and mustard. Meat flavorings and tenderizers. Bouillon cubes. Hot sauce. Tabasco sauce. Marinades. Taco seasonings. Relishes.  Fats and Oils  Butter, stick margarine, lard, shortening, ghee, and bacon fat. Coconut, palm kernel, or palm oils. Regular salad dressings.  Other  Pickles and olives. Salted popcorn and pretzels.  The items listed above may not be a complete list of foods and beverages to avoid. Contact your dietitian for more information.  WHERE CAN I FIND MORE INFORMATION?  National Heart, Lung, and Blood Institute: www.nhlbi.nih.gov/health/health-topics/topics/dash/     This information is not intended to replace advice given to you by your health care provider. Make sure you discuss any questions you have with your health care provider.     Document Released: 04/19/2011 Document Revised: 05/21/2014 Document Reviewed: 03/04/2013  Elsevier Interactive Patient Education ©2016 [employer]vier Inc.

## 2015-10-13 MED FILL — ?LISINOPRIL 10 MG TABLET: 10 | 30 days supply | Qty: 90 | Fill #0

## 2015-10-13 MED FILL — ?AMLODIPINE BESYLATE 10 MG: 10 | 30 days supply | Qty: 30 | Fill #0

## 2015-10-19 MED FILL — ATORVASTATIN 80 MG TABLET: 80 | 30 days supply | Qty: 30 | Fill #1

## 2015-11-17 MED FILL — ?LISINOPRIL 10 MG TABLET: 10 | 30 days supply | Qty: 90 | Fill #1

## 2015-11-17 MED FILL — ?AMLODIPINE BESYLATE 10 MG: 10 | 30 days supply | Qty: 30 | Fill #1

## 2015-11-17 MED FILL — ATORVASTATIN 80 MG TABLET: 80 | 30 days supply | Qty: 30 | Fill #2

## 2015-11-18 ENCOUNTER — Ambulatory Visit (INDEPENDENT_AMBULATORY_CARE_PROVIDER_SITE_OTHER): Payer: Self-pay | Admitting: Family Medicine

## 2015-11-18 VITALS — BP 150/92 | HR 57 | Temp 98.7°F | Resp 14 | Ht 64.0 in | Wt 158.0 lb

## 2015-11-18 DIAGNOSIS — I10 Essential (primary) hypertension: Secondary | ICD-10-CM

## 2015-11-18 MED ORDER — HYDROCHLOROTHIAZIDE 12.5 MG PO TABS: 12.5000 mg | ORAL_TABLET | Freq: Every day | ORAL | Status: DC

## 2015-11-18 MED FILL — ?HYDROCHLOROTHIAZIDE 12.5MG: 12.5 | 30 days supply | Qty: 30 | Fill #0

## 2015-11-18 NOTE — Patient Instructions (Signed)
Add hydrocholorizide 12.5 (very small dose) once a day. Recheck by nurse in one months

## 2015-12-19 ENCOUNTER — Ambulatory Visit: Payer: Self-pay

## 2015-12-19 VITALS — BP 145/85

## 2015-12-19 DIAGNOSIS — I160 Hypertensive urgency: Secondary | ICD-10-CM

## 2015-12-19 MED FILL — ?HYDROCHLOROTHIAZIDE 12.5MG: 12.5 | 30 days supply | Qty: 30 | Fill #1

## 2015-12-19 MED FILL — ?LISINOPRIL 10 MG TABLET: 10 | 30 days supply | Qty: 90 | Fill #2

## 2015-12-19 MED FILL — ATORVASTATIN 80 MG TABLET: 80 | 30 days supply | Qty: 30 | Fill #3

## 2015-12-19 MED FILL — ?AMLODIPINE BESYLATE 10 MG: 10 | 30 days supply | Qty: 30 | Fill #2

## 2016-01-25 MED FILL — AMLODIPINE BESYLATE 10 MG T: 10 | 30 days supply | Qty: 30 | Fill #3

## 2016-01-25 MED FILL — HYDROCHLOROTHIAZIDE 12.5 MG: 12.5 | 30 days supply | Qty: 30 | Fill #2

## 2016-01-25 MED FILL — ATORVASTATIN 80 MG TABLET: 80 | 30 days supply | Qty: 30 | Fill #4

## 2016-01-25 MED FILL — ?LISINOPRIL 10 MG TABLET: 10 | 30 days supply | Qty: 90 | Fill #3

## 2016-02-21 ENCOUNTER — Ambulatory Visit: Payer: Self-pay | Admitting: Family Medicine

## 2016-03-12 MED FILL — HYDROCHLOROTHIAZIDE 12.5 MG: 12.5 | 30 days supply | Qty: 30 | Fill #3

## 2016-03-12 MED FILL — ?AMLODIPINE BESYLATE 10 MG: 10 | 30 days supply | Qty: 30 | Fill #4

## 2016-03-12 MED FILL — ATORVASTATIN 80 MG TABLET: 80 | 30 days supply | Qty: 30 | Fill #5

## 2016-03-12 MED FILL — ?LISINOPRIL 10 MG TABLET: 10 | 30 days supply | Qty: 90 | Fill #4

## 2016-03-27 ENCOUNTER — Encounter: Payer: Self-pay | Admitting: Family Medicine

## 2016-03-27 ENCOUNTER — Ambulatory Visit (INDEPENDENT_AMBULATORY_CARE_PROVIDER_SITE_OTHER): Payer: Self-pay | Admitting: Family Medicine

## 2016-03-27 VITALS — BP 152/92 | HR 73 | Temp 98.4°F | Resp 16 | Ht 64.0 in | Wt 162.0 lb

## 2016-03-27 DIAGNOSIS — I160 Hypertensive urgency: Secondary | ICD-10-CM

## 2016-03-27 DIAGNOSIS — Z0000 Encounter for general adult medical examination without abnormal findings: Secondary | ICD-10-CM

## 2016-03-27 DIAGNOSIS — E785 Hyperlipidemia, unspecified: Secondary | ICD-10-CM

## 2016-03-27 LAB — CBC WITH DIFFERENTIAL/PLATELET
BASOS PCT: 0 %
Basophils Absolute: 0 cells/uL (ref 0–200)
Eosinophils Absolute: 156 cells/uL (ref 15–500)
Eosinophils Relative: 2 %
HCT: 41.1 % (ref 38.5–50.0)
Hemoglobin: 14.1 g/dL (ref 13.2–17.1)
LYMPHS PCT: 22 %
Lymphs Abs: 1716 cells/uL (ref 850–3900)
MCH: 32.9 pg (ref 27.0–33.0)
MCHC: 34.3 g/dL (ref 32.0–36.0)
MCV: 96 fL (ref 80.0–100.0)
MONO ABS: 468 {cells}/uL (ref 200–950)
MONOS PCT: 6 %
MPV: 9.5 fL (ref 7.5–12.5)
NEUTROS ABS: 5460 {cells}/uL (ref 1500–7800)
Neutrophils Relative %: 70 %
PLATELETS: 284 10*3/uL (ref 140–400)
RBC: 4.28 MIL/uL (ref 4.20–5.80)
RDW: 13.2 % (ref 11.0–15.0)
WBC: 7.8 10*3/uL (ref 3.8–10.8)

## 2016-03-27 LAB — LIPID PANEL
Cholesterol: 147 mg/dL (ref <200)
HDL: 43 mg/dL (ref >40)
LDL Cholesterol: 57 mg/dL (ref <100)
TRIGLYCERIDES: 236 mg/dL — AB (ref <150)
Total CHOL/HDL Ratio: 3.4 Ratio (ref <5.0)
VLDL: 47 mg/dL — ABNORMAL HIGH (ref <30)

## 2016-03-27 LAB — COMPLETE METABOLIC PANEL WITH GFR
ALT: 24 U/L (ref 9–46)
AST: 15 U/L (ref 10–40)
Albumin: 4.5 g/dL (ref 3.6–5.1)
Alkaline Phosphatase: 41 U/L (ref 40–115)
BUN: 26 mg/dL — ABNORMAL HIGH (ref 7–25)
CALCIUM: 9.3 mg/dL (ref 8.6–10.3)
CHLORIDE: 102 mmol/L (ref 98–110)
CO2: 27 mmol/L (ref 20–31)
CREATININE: 1.2 mg/dL (ref 0.60–1.35)
GFR, EST AFRICAN AMERICAN: 87 mL/min (ref >=60)
GFR, EST NON AFRICAN AMERICAN: 75 mL/min (ref >=60)
Glucose, Bld: 100 mg/dL — ABNORMAL HIGH (ref 65–99)
Potassium: 3.8 mmol/L (ref 3.5–5.3)
Sodium: 137 mmol/L (ref 135–146)
Total Bilirubin: 0.4 mg/dL (ref 0.2–1.2)
Total Protein: 7.1 g/dL (ref 6.1–8.1)

## 2016-03-27 LAB — HIV ANTIBODY (ROUTINE TESTING W REFLEX): HIV 1&2 Ab, 4th Generation: NONREACTIVE

## 2016-03-27 LAB — HEMOGLOBIN A1C
HEMOGLOBIN A1C: 5.3 % (ref <5.7)
MEAN PLASMA GLUCOSE: 105 mg/dL

## 2016-03-27 MED ORDER — ASPIRIN 325 MG PO TBEC: 325.0000 mg | DELAYED_RELEASE_TABLET | Freq: Every day | ORAL | 5 refills | Status: DC

## 2016-03-27 MED ORDER — ATORVASTATIN CALCIUM 80 MG PO TABS: 80.0000 mg | ORAL_TABLET | Freq: Every day | ORAL | 5 refills | Status: DC

## 2016-03-27 MED ORDER — HYDROCHLOROTHIAZIDE 12.5 MG PO TABS: 12.5000 mg | ORAL_TABLET | Freq: Every day | ORAL | 5 refills | Status: DC

## 2016-03-27 MED ORDER — AMLODIPINE BESYLATE 10 MG PO TABS
10.0000 mg | ORAL_TABLET | Freq: Every day | ORAL | 5 refills | Status: DC
Start: 2016-03-27 — End: 2017-06-25

## 2016-03-27 MED ORDER — LISINOPRIL 40 MG PO TABS: 40.0000 mg | ORAL_TABLET | Freq: Every day | ORAL | 5 refills | Status: DC

## 2016-03-28 NOTE — Patient Instructions (Signed)
Continue currentl medications Follow-up in three months.

## 2016-03-28 NOTE — Progress Notes (Signed)
Chris Phelps, is a 40 y.o. male  [id number]  [id number]  DOB - 06/07/1975  CC:  Chief Complaint  Patient presents with  . Hypertension       HPI: Chris Phelps is a 40 y.o. male here for follow-up hypertension and hyperlipidemia. He reports no significant change in his condition since his last visit. He has a history of a CVA and is followed by neurology. He reports (with help of interpreter) that he does not follow any particular dietary program and does not exercise regularly.   No Known Allergies Past Medical History:  Diagnosis Date  . Hyperlipemia   . Hypertension   . Medical history non-contributory   . Stroke [facility](HCC)    No current outpatient prescriptions on file prior to visit.   No current facility-administered medications on file prior to visit.    Family History  Problem Relation Age of Onset  . Hypertension     Social History   Social History  . Marital status: Single    Spouse name: N/A  . Number of children: N/A  . Years of education: N/A   Occupational History  . Not on file.   Social History Main Topics  . Smoking status: Never Smoker  . Smokeless tobacco: Never Used  . Alcohol use 0.6 oz/week    1 Cans of beer per week     Comment: social beer  . Drug use: No     Comment: past marijuana use x 2 years ago  . Sexual activity: Yes   Other Topics Concern  . Not on file   Social History Narrative  . No narrative on file    Review of Systems: Constitutional: Negative Skin: Negative HENT: Negative  Eyes: Blind right eye since stroke Neck: Negative Respiratory: Negative Cardiovascular: Negative Gastrointestinal: Negative Genitourinary: Negative  Musculoskeletal: Negative   Neurological: Negative for Hematological: Negative  Psychiatric/Behavioral: Negative    Objective:   Vitals:   03/27/16 1320 03/27/16 1331  BP: (!) 161/101 (!) 152/92  Pulse: 73   Resp: 16   Temp: 98.4 F (36.9 C)     Physical  Exam: Constitutional: Patient appears well-developed and well-nourished. No distress. HENT: Normocephalic, atraumatic, External right and left ear normal. Oropharynx is clear and moist.  Eyes: Conjunctivae and EOM are normal. PERRLA, no scleral icterus. Neck: Normal ROM. Neck supple. No lymphadenopathy, No thyromegaly. CVS: RRR, S1/S2 +, no murmurs, no gallops, no rubs Pulmonary: Effort and breath sounds normal, no stridor, rhonchi, wheezes, rales.  Abdominal: Soft. Normoactive BS,, no distension, tenderness, rebound or guarding.  Musculoskeletal: Normal range of motion. No edema and no tenderness.  Neuro: Alert.Normal muscle tone coordination. Non-focal Skin: Skin is warm and dry. No rash noted. Not diaphoretic. No erythema. No pallor. Psychiatric: Normal mood and affect. Behavior, judgment, thought content normal.  Lab Results  Component Value Date   WBC 7.8 03/27/2016   HGB 14.1 03/27/2016   HCT 41.1 03/27/2016   MCV 96.0 03/27/2016   PLT 284 03/27/2016   Lab Results  Component Value Date   CREATININE 1.20 03/27/2016   BUN 26 (H) 03/27/2016   NA 137 03/27/2016   K 3.8 03/27/2016   CL 102 03/27/2016   CO2 27 03/27/2016    Lab Results  Component Value Date   HGBA1C 5.3 03/27/2016   Lipid Panel     Component Value Date/Time   CHOL 147 03/27/2016 1353   TRIG 236 (H) 03/27/2016 1353   HDL 43 03/27/2016 1353   CHOLHDL  3.4 03/27/2016 1353   VLDL 47 (H) 03/27/2016 1353   LDLCALC 57 03/27/2016 1353        Assessment and plan:   1. Dyslipidemia  - atorvastatin (LIPITOR) 80 MG tablet; Take 1 tablet (80 mg total) by mouth daily at 6 PM.  Dispense: 30 tablet; Refill: 5 - aspirin 325 MG EC tablet; Take 1 tablet (325 mg total) by mouth daily.  Dispense: 30 tablet; Refill: 5  2. Hypertensive urgency  - amLODipine (NORVASC) 10 MG tablet; Take 1 tablet (10 mg total) by mouth daily.  Dispense: 30 tablet; Refill: 5  3. Healthcare maintenance  - Hemoglobin A1c - COMPLETE  METABOLIC PANEL WITH GFR - CBC with Differential - Lipid panel - HIV antibody (with reflex)   Return in about 3 months (around 06/27/2016).  The patient was given clear instructions to go to ER or return to medical center if symptoms don't improve, worsen or new problems develop. The patient verbalized understanding.    Henrietta HooverLinda C Johnika Escareno FNP  03/28/2016, 1:11 PM

## 2016-04-10 ENCOUNTER — Ambulatory Visit: Payer: Self-pay | Admitting: Nurse Practitioner

## 2016-04-13 ENCOUNTER — Other Ambulatory Visit: Payer: Self-pay | Admitting: Family Medicine

## 2016-04-13 DIAGNOSIS — E785 Hyperlipidemia, unspecified: Secondary | ICD-10-CM

## 2016-04-13 MED FILL — HYDROCHLOROTHIAZIDE 12.5 MG: 12.5 | 30 days supply | Qty: 30 | Fill #4

## 2016-04-13 MED FILL — ?LISINOPRIL 10 MG TABLET: 10 | 30 days supply | Qty: 90 | Fill #5

## 2016-04-13 MED FILL — AMLODIPINE BESYLATE 10 MG T: 10 | 30 days supply | Qty: 30 | Fill #5

## 2016-04-13 MED FILL — ATORVASTATIN 80 MG TABLET: 80 | 30 days supply | Qty: 30 | Fill #0

## 2016-04-13 NOTE — Progress Notes (Signed)
Chris Phelps, is a 40 y.o. male  ZOX:[id number]  [id number]:[id number]  DOB - 17-Feb-1976  CC:  Chief Complaint  Patient presents with  . Hypertension       HPI: Chris Phelps is a 40 y.o. male here to follow-up on hypertension. His BP today is 158/90. He is taking lisinopril 20 and amlodipine 10 mg. He also has a history of a CVA and hyperlipidemia.   No Known Allergies Past Medical History:  Diagnosis Date  . Hyperlipemia   . Hypertension   . Medical history non-contributory   . Stroke [facility](HCC)    No current outpatient prescriptions on file prior to visit.   No current facility-administered medications on file prior to visit.    Family History  Problem Relation Age of Onset  . Hypertension     Social History   Social History  . Marital status: Single    Spouse name: N/A  . Number of children: N/A  . Years of education: N/A   Occupational History  . Not on file.   Social History Main Topics  . Smoking status: Never Smoker  . Smokeless tobacco: Never Used  . Alcohol use 0.6 oz/week    1 Cans of beer per week     Comment: social beer  . Drug use: No     Comment: past marijuana use x 2 years ago  . Sexual activity: Yes   Other Topics Concern  . Not on file   Social History Narrative  . No narrative on file    Review of Systems: Constitutional: Negative Skin: Negative HENT: Negative  Eyes: Negative  Neck: Negative Respiratory: Negative Cardiovascular: Negative Gastrointestinal: Negative Genitourinary: Negative  Musculoskeletal: Negative   Neurological: Negative for Hematological: Negative  Psychiatric/Behavioral: Negative    Objective:   Vitals:   11/18/15 1536 11/18/15 1559  BP: (!) 158/90 (!) 150/92  Pulse: (!) 57   Resp: 14   Temp: 98.7 F (37.1 C)     Physical Exam: Constitutional: Patient appears well-developed and well-nourished. No distress. HENT: Normocephalic, atraumatic, External right and left ear normal. Oropharynx is  clear and moist.  Eyes: Conjunctivae and EOM are normal. PERRLA, no scleral icterus. Neck: Normal ROM. Neck supple. No lymphadenopathy, No thyromegaly. CVS: RRR, S1/S2 +, no murmurs, no gallops, no rubs Pulmonary: Effort and breath sounds normal, no stridor, rhonchi, wheezes, rales.  Abdominal: Soft. Normoactive BS,, no distension, tenderness, rebound or guarding.  Musculoskeletal: Normal range of motion. No edema and no tenderness.  Neuro: Alert.Normal muscle tone coordination. Non-focal Skin: Skin is warm and dry. No rash noted. Not diaphoretic. No erythema. No pallor. Psychiatric: Normal mood and affect. Behavior, judgment, thought content normal.  Lab Results  Component Value Date   WBC 7.8 03/27/2016   HGB 14.1 03/27/2016   HCT 41.1 03/27/2016   MCV 96.0 03/27/2016   PLT 284 03/27/2016   Lab Results  Component Value Date   CREATININE 1.20 03/27/2016   BUN 26 (H) 03/27/2016   NA 137 03/27/2016   K 3.8 03/27/2016   CL 102 03/27/2016   CO2 27 03/27/2016    Lab Results  Component Value Date   HGBA1C 5.3 03/27/2016   Lipid Panel     Component Value Date/Time   CHOL 147 03/27/2016 1353   TRIG 236 (H) 03/27/2016 1353   HDL 43 03/27/2016 1353   CHOLHDL 3.4 03/27/2016 1353   VLDL 47 (H) 03/27/2016 1353   LDLCALC 57 03/27/2016 1353  Assessment and plan:   1. Essential hypertension -continue amlodipine and lisinopril -Add HCTZ 25 mg daily.     Return in about 4 weeks (around 12/16/2015) for HTN.  The patient was given clear instructions to go to ER or return to medical center if symptoms don't improve, worsen or new problems develop. The patient verbalized understanding.    Chris HooverLinda C Ellen Goris FNP  04/13/2016, 2:43 PM

## 2016-06-20 ENCOUNTER — Other Ambulatory Visit: Payer: Self-pay | Admitting: Family Medicine

## 2016-06-20 MED FILL — ATORVASTATIN 80 MG TABLET: 80 | 30 days supply | Qty: 30 | Fill #1

## 2016-06-20 MED FILL — ?HYDROCHLOROTHIAZIDE 12.5MG: 12.5 | 30 days supply | Qty: 30 | Fill #5

## 2016-06-20 MED FILL — ?AMLODIPINE BESYLATE 10 MG: 10 | 30 days supply | Qty: 30 | Fill #0

## 2016-06-20 MED FILL — LISINOPRIL 10 MG TABLET: 10 | 30 days supply | Qty: 90 | Fill #0

## 2016-06-28 ENCOUNTER — Ambulatory Visit: Payer: Self-pay | Admitting: Family Medicine

## 2016-07-24 IMAGING — US US RENAL
1 series · 14 of 25 positions shown · non-contrast
Comparison: None.

CLINICAL DATA: Acute kidney injury

EXAM:
RENAL / URINARY TRACT ULTRASOUND COMPLETE

[Series 1: us renal · 0.28mm/px · 14 of 39 slices shown]
[im 1/39]
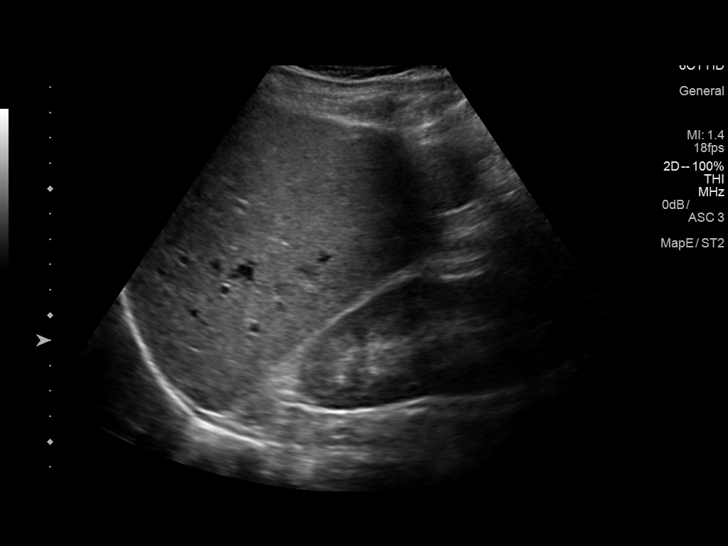
[im 4/39]
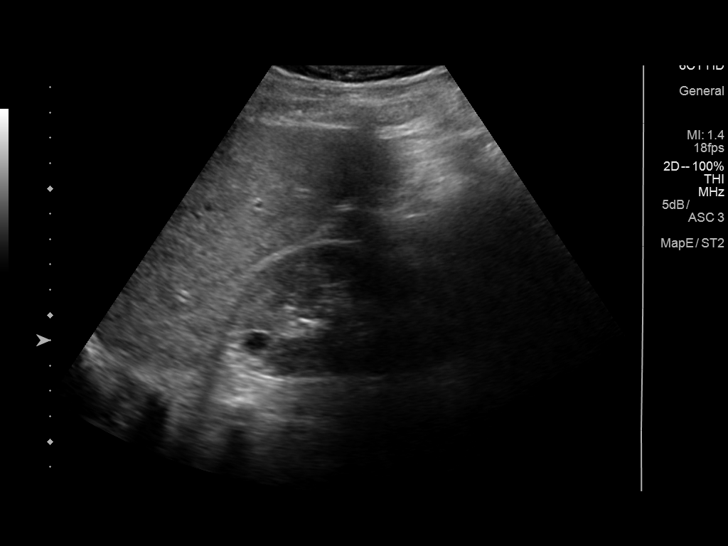
[im 7/39]
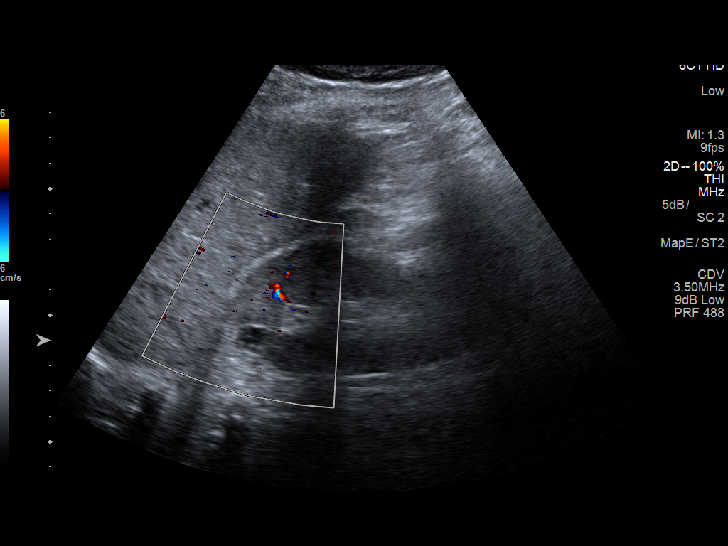
[im 10/39]
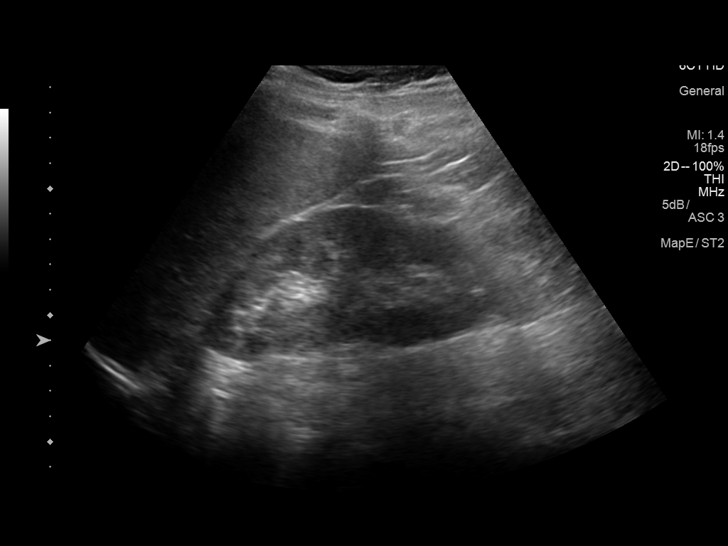
[im 13/39]
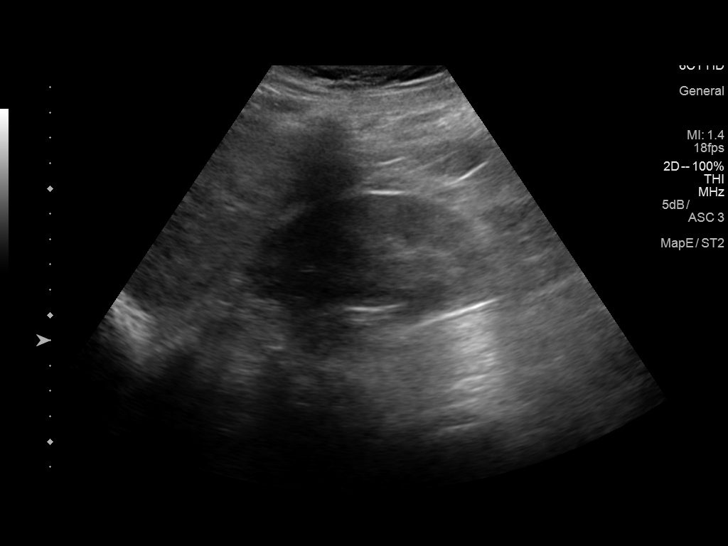
[im 15/39]
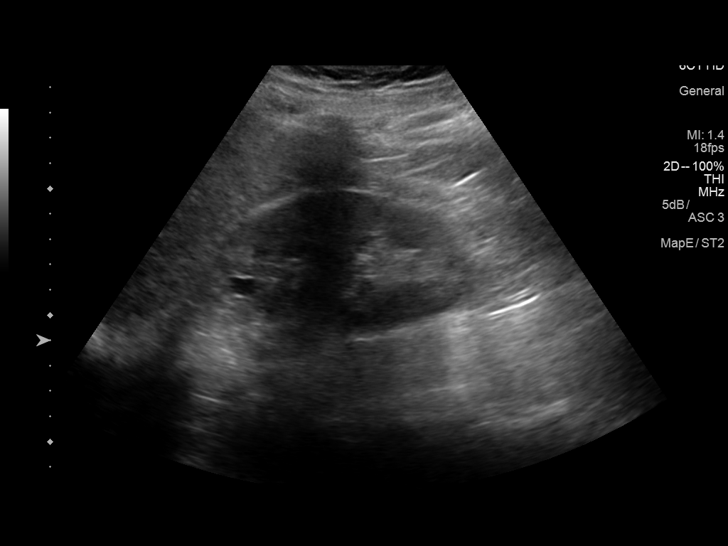
[im 18/39]
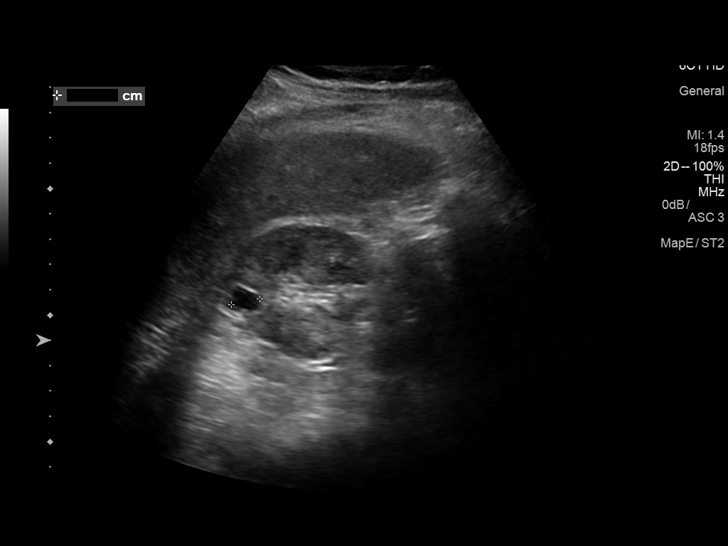
[im 21/39]
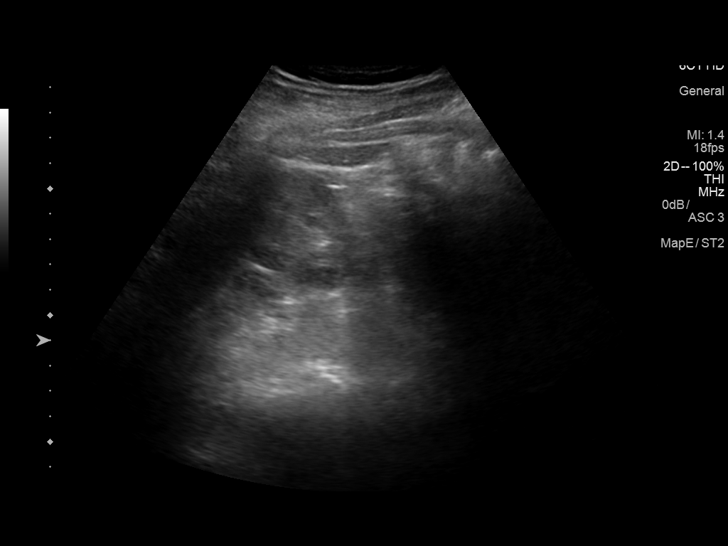
[im 24/39]
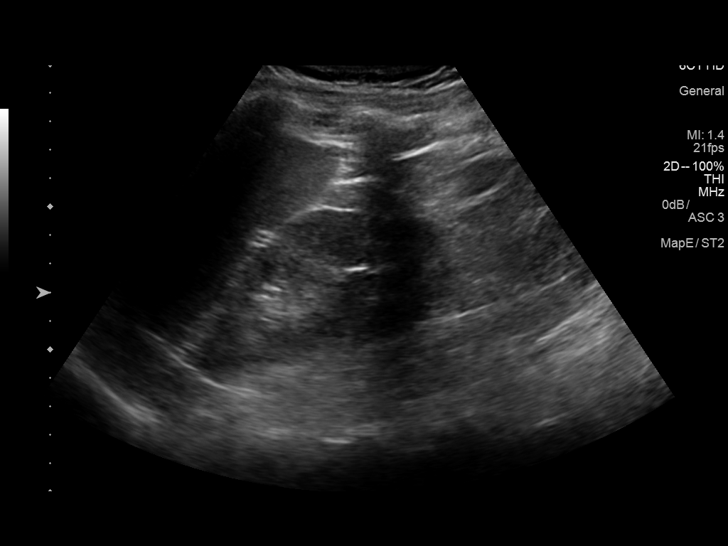
[im 26/39]
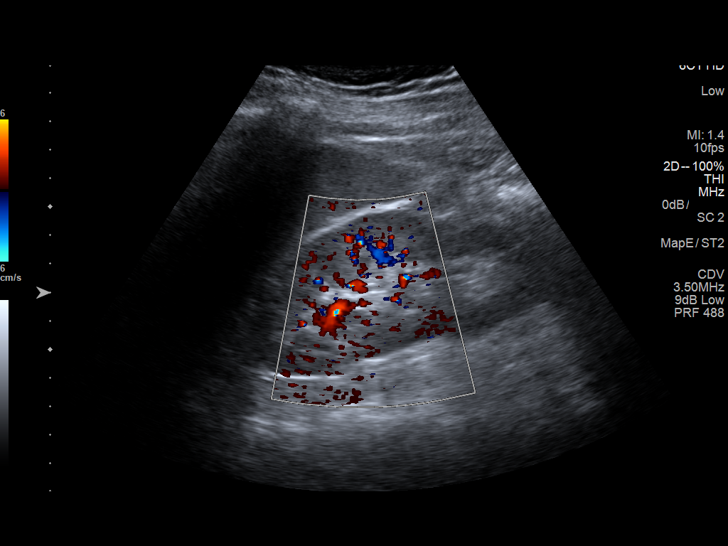
[im 29/39]
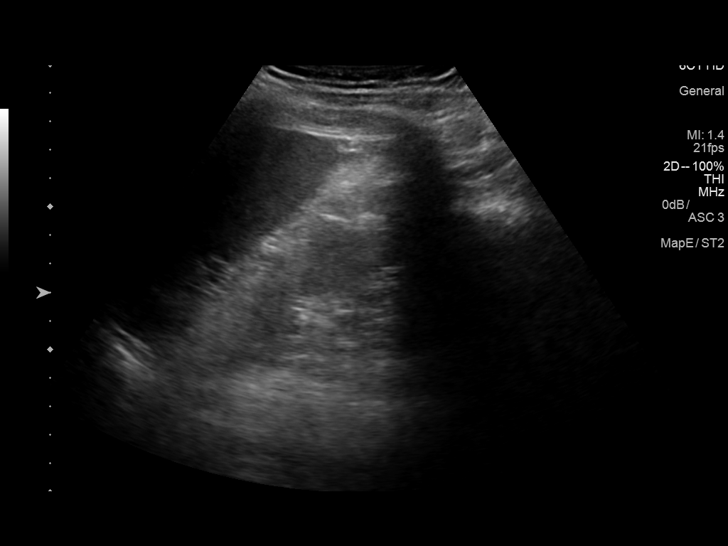
[im 32/39]
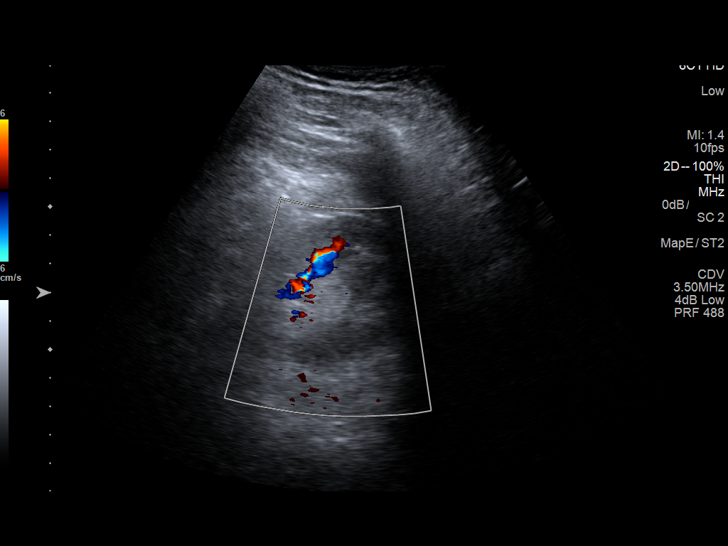
[im 35/39]
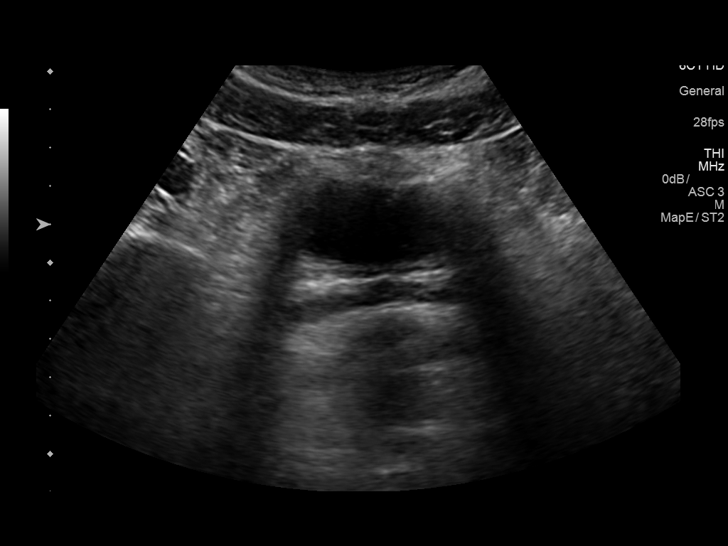
[im 39/39]
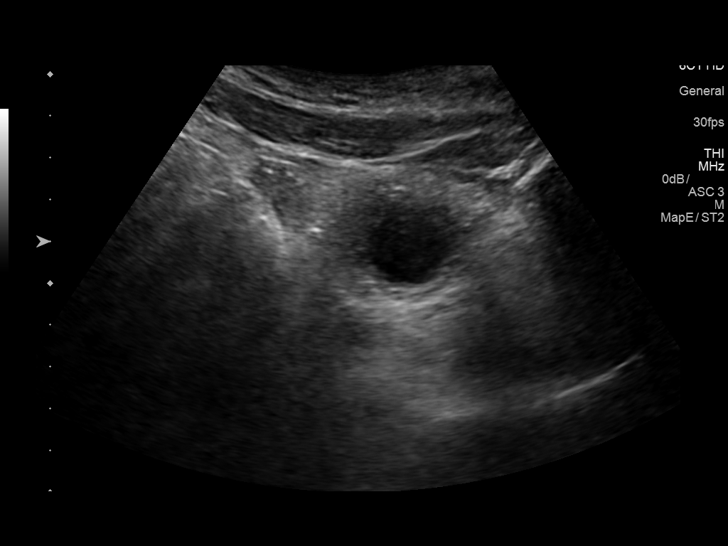

[14 of 25 positions shown; findings below may reference images not displayed]

FINDINGS: Right Kidney:

Length: 11.7 cm. Normal cortical thickness and echogenicity. Tiny
cyst upper pole 10 x 7 x 12 mm. No additional mass, hydronephrosis
or shadowing calcification.

Left Kidney:

Length: 11.9 cm. Normal cortical thickness and echogenicity. No
mass, hydronephrosis or shadowing calcification.

Bladder:

Decompressed by Foley catheter, unable to adequately assess.
IMPRESSION: Tiny RIGHT renal cyst 12 mm greatest size.

Otherwise negative exam.

## 2016-07-31 ENCOUNTER — Encounter: Payer: Self-pay | Admitting: Family Medicine

## 2016-07-31 ENCOUNTER — Ambulatory Visit (INDEPENDENT_AMBULATORY_CARE_PROVIDER_SITE_OTHER): Payer: Self-pay | Admitting: Family Medicine

## 2016-07-31 VITALS — BP 160/90 | HR 72 | Temp 98.7°F | Resp 16 | Ht 64.0 in | Wt 164.0 lb

## 2016-07-31 DIAGNOSIS — Z789 Other specified health status: Secondary | ICD-10-CM

## 2016-07-31 DIAGNOSIS — I10 Essential (primary) hypertension: Secondary | ICD-10-CM

## 2016-07-31 DIAGNOSIS — E785 Hyperlipidemia, unspecified: Secondary | ICD-10-CM

## 2016-07-31 LAB — POCT URINALYSIS DIP (DEVICE)
Bilirubin Urine: NEGATIVE
GLUCOSE, UA: 100 mg/dL — AB
HGB URINE DIPSTICK: NEGATIVE
Ketones, ur: NEGATIVE mg/dL
Leukocytes, UA: NEGATIVE
Nitrite: NEGATIVE
PH: 5 (ref 5.0–8.0)
PROTEIN: NEGATIVE mg/dL
SPECIFIC GRAVITY, URINE: 1.01 (ref 1.005–1.030)
UROBILINOGEN UA: 0.2 mg/dL (ref 0.0–1.0)

## 2016-07-31 LAB — COMPLETE METABOLIC PANEL WITH GFR
ALT: 19 U/L (ref 9–46)
AST: 13 U/L (ref 10–40)
Albumin: 4.6 g/dL (ref 3.6–5.1)
Alkaline Phosphatase: 43 U/L (ref 40–115)
BUN: 18 mg/dL (ref 7–25)
CALCIUM: 9.2 mg/dL (ref 8.6–10.3)
CO2: 26 mmol/L (ref 20–31)
Chloride: 101 mmol/L (ref 98–110)
Creat: 1.14 mg/dL (ref 0.60–1.35)
GFR, EST NON AFRICAN AMERICAN: 80 mL/min (ref >=60)
Glucose, Bld: 128 mg/dL — ABNORMAL HIGH (ref 65–99)
POTASSIUM: 3.7 mmol/L (ref 3.5–5.3)
Sodium: 138 mmol/L (ref 135–146)
Total Bilirubin: 0.4 mg/dL (ref 0.2–1.2)
Total Protein: 7.1 g/dL (ref 6.1–8.1)

## 2016-07-31 LAB — POCT GLYCOSYLATED HEMOGLOBIN (HGB A1C): HEMOGLOBIN A1C: 5.5

## 2016-07-31 MED ORDER — HYDROCHLOROTHIAZIDE 25 MG PO TABS: 25.0000 mg | ORAL_TABLET | Freq: Every day | ORAL | 5 refills | Status: DC

## 2016-07-31 MED ORDER — LISINOPRIL 40 MG PO TABS: 40.0000 mg | ORAL_TABLET | Freq: Every day | ORAL | 5 refills | Status: DC

## 2016-07-31 MED FILL — HYDROCHLOROTHIAZIDE 25 MG T: 25 | 30 days supply | Qty: 30 | Fill #0

## 2016-07-31 MED FILL — LISINOPRIL 40 MG TABLET: 40 | 30 days supply | Qty: 30 | Fill #0

## 2016-07-31 NOTE — Progress Notes (Signed)
Subjective:    Patient ID: Chris Phelps, male    DOB: 03-21-1976, 41 y.o.   MRN: [id number]  Chris Phelps, a 41 year old male that presents accompanied by wife. Patient primarily speaks spanish. Using video interpreter to assist with communication. He says that he has been taking Lisinopril 30 mg consistently. He is not exercising or following a low sodium diet. He is on daily ASA and statin therapy. He has not been checking blood pressures at home. Patient denies chest pain, claudication, dyspnea, fatigue, irregular heart beat, near-syncope, orthopnea, palpitations and tachypnea.  Cardiovascular risk factors include: male gender and sedentary lifestyle.  Hypertension  This is a chronic problem. The current episode started more than 1 year ago. The problem has been waxing and waning since onset. The problem is uncontrolled. Pertinent negatives include no anxiety, blurred vision, chest pain, headaches, malaise/fatigue, neck pain, orthopnea, palpitations, peripheral edema, PND, shortness of breath or sweats. Risk factors for coronary artery disease include obesity, sedentary lifestyle, male gender and dyslipidemia. Past treatments include calcium channel blockers, ACE inhibitors and diuretics. There is no history of angina, kidney disease, CAD/MI, CVA, heart failure, left ventricular hypertrophy or PVD. There is no history of sleep apnea or a thyroid problem.    Past Medical History:  Diagnosis Date  . Hyperlipemia   . Hypertension   . Medical history non-contributory   . Stroke [facility])   No Known Allergies  Social History   Social History  . Marital status: Single    Spouse name: N/A  . Number of children: N/A  . Years of education: N/A   Occupational History  . Not on file.   Social History Main Topics  . Smoking status: Never Smoker  . Smokeless tobacco: Never Used  . Alcohol use 0.6 oz/week    1 Cans of beer per week     Comment: social beer  . Drug use: No   Comment: past marijuana use x 2 years ago  . Sexual activity: Yes   Other Topics Concern  . Not on file   Social History Narrative  . No narrative on file   Immunization History  Administered Date(s) Administered  . Tdap 09/06/2015     Review of Systems  Constitutional: Negative.  Negative for fatigue, fever and malaise/fatigue.  HENT: Negative.   Eyes: Negative.  Negative for blurred vision, photophobia, redness and visual disturbance.  Respiratory: Negative.  Negative for shortness of breath and stridor.   Cardiovascular: Negative.  Negative for chest pain, palpitations, orthopnea and PND.  Gastrointestinal: Negative.   Endocrine: Negative.  Negative for cold intolerance, heat intolerance, polydipsia, polyphagia and polyuria.  Genitourinary: Negative.   Musculoskeletal: Negative.  Negative for neck pain.  Skin: Negative.   Allergic/Immunologic: Negative.  Negative for immunocompromised state.  Neurological: Negative.  Negative for dizziness, facial asymmetry and headaches.  Hematological: Negative.   Psychiatric/Behavioral: Negative.        Objective:   Physical Exam  Constitutional: He is oriented to person, place, and time. He appears well-developed and well-nourished.  HENT:  Head: Normocephalic and atraumatic.  Right Ear: External ear normal.  Left Ear: External ear normal.  Nose: Nose normal.  Mouth/Throat: Oropharynx is clear and moist.  Eyes: Conjunctivae and EOM are normal. Pupils are equal, round, and reactive to light.  Neck: Normal range of motion. Neck supple.  Cardiovascular: Normal rate, regular rhythm and intact distal pulses.   Murmur heard. Pulmonary/Chest: Effort normal and breath sounds normal.  Abdominal: Soft.  Bowel sounds are normal.  Musculoskeletal: Normal range of motion.  Neurological: He is alert and oriented to person, place, and time. He has normal reflexes.  Skin: Skin is warm and dry.  Psychiatric: He has a normal mood and affect. His  behavior is normal. Judgment and thought content normal.       BP (!) 160/90 (BP Location: Left Arm, Patient Position: Sitting, Cuff Size: Normal)   Pulse 72   Temp 98.7 F (37.1 C) (Oral)   Resp 16   Ht 5\' 4"  (1.626 m)   Wt 164 lb (74.4 kg)   SpO2 100%   BMI 28.15 kg/m  Assessment & Plan:  1. Essential hypertension Blood pressure is elevated on current medication regimen. It is above goal. Will increase Lisinopril 40 mg daily and hydrochlorothiazide to 12. 5. The patient is asked to make an attempt to improve diet and exercise patterns to aid in medical management of this problem. - POCT urinalysis dip (device) - hydrochlorothiazide (HYDRODIURIL) 25 MG tablet; Take 1 tablet (25 mg total) by mouth daily.  Dispense: 30 tablet; Refill: 5 - COMPLETE METABOLIC PANEL WITH GFR  2. Dyslipidemia Recommend a lowfat, low carbohydrate diet divided over 5-6 small meals, increase water intake to 6-8 glasses, and 150 minutes per week of cardiovascular exercise.   - Lipid Panel; Future  3. Language barrier to communication Utilized video interpreter to assist with communication   RTC: 1 week for bp check and 1 month for hypertension   The patient was given clear instructions to go to ER or return to medical center if symptoms do not improve, worsen or new problems develop. The patient verbalized understanding. Will notify patient with laboratory results. Massie MaroonHollis,Lachina M, FNP

## 2016-07-31 NOTE — Patient Instructions (Addendum)
Will increase Lisinopril to 40 mg daily. Will also increase Hydrochlorothiazide to 25 mg per day.  Will return for a blood pressure check in 2 weeks. Will also obtain fasting cholesterol panel on that day.  Plan de alimentacin DASH (DASH Eating Plan) DASH es la sigla en ingls de "Enfoques Alimentarios para Detener la Hipertensin". El plan de alimentacin DASH ha demostrado bajar la presin arterial elevada (hipertensin). Los beneficios adicionales para la salud pueden incluir la disminucin del riesgo de diabetes mellitus tipo2, enfermedades cardacas e ictus. Este plan tambin puede ayudar a Geophysical data processor. QU DEBO SABER ACERCA DEL PLAN DE ALIMENTACIN DASH? Para el plan de alimentacin DASH, seguir las siguientes pautas generales:  Elija los alimentos que contienen menos de 150 miligramos de sodio por porcin (segn se indica en la etiqueta de los alimentos).  Use hierbas o aderezos sin sal, en lugar de sal de mesa o sal marina.  Consulte al mdico o farmacutico antes de usar sustitutos de la sal.  Consuma los productos con menor contenido de sodio. Estos productos suelen estar etiquetados como "bajo en sodio" o "sin agregado de sal".  Coma alimentos frescos. No consuma una gran cantidad de alimentos enlatados.  Coma ms verduras, frutas y productos lcteos con bajo contenido de [place].  Elija los cereales integrales. Busque la palabra "integral" en Estate agent de la lista de ingredientes.  Elija el pescado y el pollo o el pavo sin piel ms a menudo que las carnes rojas. Limite el consumo de pescado, carne de ave y carne a 6onzas (170g) por Futures trader.  Limite el consumo de dulces, postres, azcares y bebidas azucaradas.  Elija las grasas saludables para el corazn.  Consuma ms comida casera y menos de restaurante, de buf y comida rpida.  Limite el consumo de alimentos fritos.  No fra los alimentos. A la hora de cocinarlos, opte por hornearlos, hervirlos, grillarlos y  asarlos a Patent attorney.  Cuando coma en un restaurante, pida que preparen su comida con menos sal o, en lo posible, sin nada de sal. QU ALIMENTOS PUEDO COMER? Pida ayuda a un nutricionista para conocer las necesidades calricas individuales. Cereales Pan de salvado o integral. Arroz integral. Pastas de salvado o integrales. Quinua, trigo burgol y cereales integrales. Cereales con bajo contenido de sodio. Tortillas de harina de maz o de salvado. Pan de maz integral. Galletas saladas integrales. Galletas con bajo contenido de [place]. Vegetales Verduras frescas o congeladas (crudas, al vapor, asadas o grilladas). Jugos de tomate y verduras con contenido bajo o reducido de sodio. Pasta y salsa de tomate con contenido bajo o reducido de sodio. Verduras enlatadas con bajo contenido de sodio o reducido de sodio. Nils Pyle Nils Pyle frescas, en conserva (en su jugo natural) o frutas congeladas. Carnes y otros productos con protenas Carne de res molida (al 85% o ms [place]), carne de res de animales alimentados con pastos o carne de res sin la grasa. Pollo o pavo sin piel. Carne de pollo o de [place]. Cerdo sin la grasa. Todos los pescados y frutos de mar. Huevos. Porotos, guisantes o lentejas secos. Frutos secos y semillas sin sal. Frijoles enlatados sin sal. Lcteos Productos lcteos con bajo contenido de grasas, como [place] o al 1%, quesos reducidos en grasas o al 2%, ricota con bajo contenido de grasas o [employer], o yogur natural con bajo contenido de [place]. Quesos con contenido bajo o reducido de sodio. Grasas y Writer en barra que no contengan grasas trans. Mayonesa y alios  para ensaladas livianos o reducidos en grasas (reducidos en sodio). [place]. Aceites de crtamo, oliva o canola. Mantequilla natural de man o almendra. Otros Palomitas de maz y pretzels sin sal. Los artculos mencionados arriba pueden no ser Raytheonuna lista completa de las bebidas o los alimentos  recomendados. Comunquese con el nutricionista para conocer ms opciones. QU ALIMENTOS NO SE RECOMIENDAN? Cereales Pan blanco. Pastas blancas. Arroz blanco. Pan de maz refinado. Bagels y croissants. Galletas saladas que contengan grasas trans. Vegetales Vegetales con crema o fritos. Verduras en salsa de [place]. Verduras enlatadas comunes. Pasta y salsa de tomate en lata comunes. Jugos comunes de tomate y de verduras. Nils PyleFrutas Fruta enlatada en almbar liviano o espeso. Jugo de frutas. Carnes y otros productos con protenas Cortes de carne con Holiday representativegrasa. Costillas, alas de pollo, tocineta, salchicha, mortadela, salame, chinchulines, tocino, perros calientes, salchichas alemanas y embutidos envasados. Frutos secos y semillas con sal. Frijoles con sal en lata. Lcteos Leche entera o al 2%, crema, mezcla de Sulphur Springsleche y crema, y queso crema. Yogur entero o endulzado. Quesos o queso azul con alto contenido de Neurosurgeongrasas. Cremas no lcteas y coberturas batidas. Quesos procesados, quesos para untar o cuajadas. Condimentos Sal de cebolla y ajo, sal condimentada, sal de mesa y sal marina. Salsas en lata y envasadas. Salsa [place]. Salsa trtara. Salsa barbacoa. Salsa teriyaki. Salsa de soja, incluso la que tiene contenido reducido de Hilldalesodio. Salsa de carne. Salsa de pescado. Salsa de Salisburyostras. Salsa rosada. Rbano picante. Ketchup y mostaza. Saborizantes y tiernizantes para carne. Caldo en cubitos. Salsa picante. Salsa tabasco. Adobos. Aderezos para tacos. Salsas. Grasas y 24[address] Mainaceites [place], Indiamargarina en barra, Bird-in[place] de Basaltcerdo, Man[place]agrasa, Singaporemantequilla clarificada y Steffanie Rainwatergrasa de tocino. Aceites de coco, de palmiste o de palma. Aderezos comunes para ensalada. Otros Pickles y Gilmanaceitunas. Palomitas de maz y pretzels con sal. Los artculos mencionados arriba pueden no ser Raytheonuna lista completa de las bebidas y los alimentos que se Theatre stage managerdeben evitar. Comunquese con el nutricionista para obtener ms informacin. DNDE Raelyn MoraPUEDO  ENCONTRAR MS INFORMACIN? Instituto Nacional del Broctonorazn, del Pulmn y de Risk managerla Sangre (National Heart, Lung, and Blood Institute): CablePromo.itwww.nhlbi.nih.gov/health/health-topics/topics/dash/ Esta informacin no tiene Theme park managercomo fin reemplazar el consejo del mdico. Asegrese de hacerle al mdico cualquier pregunta que tenga. Document Released: 04/19/2011 Document Revised: 08/22/2015 Document Reviewed: 03/04/2013 Elsevier Interactive Patient Education  2017 Elsevier Inc. Opciones de alimentos para bajar el nivel de triglicridos (Food Choices to Lower Your Triglycerides) Los triglicridos son un tipo de grasas que se Hotel managerencuentran en la sangre. Un nivel elevado de triglicridos puede aumentar el riesgo de padecer enfermedades cardacas e infartos. Si sus niveles de triglicridos son altos, los alimentos que se ingieren y los hbitos de alimentacin son Engineer, productionmuy importantes. Elegir los alimentos adecuados puede ayudar a Stage managerbajar el nivel de triglicridos. QU PAUTAS GENERALES DEBO SEGUIR?  Baje de peso si es necesario.  Limite o evite el alcohol.  Llene la mitad del plato con vegetales y ensaladas de hojas verdes.  Limite las frutas a dos porciones por da. Elija frutas en lugar de jugos.  Ocupe un cuarto del plato con cereales integrales. Busque la palabra "integral" en Estate agentel primer lugar de la lista de ingredientes.  Llene un cuarto del plato con alimentos con protenas magras.  Disfrute de pescados grasos (como salmn, caballa, sardinas y atn) tres veces por semana.  Elija las grasas saludables.  Limite los alimentos con alto contenido de almidn y International aid/development workerazcar.  Consuma ms comida casera y menos de restaurante, de buf y comida rpida.  Limite el consumo de alimentos fritos.  Cocine los alimentos utilizando mtodos que no sean la fritura.  Limite el consumo de grasas saturadas.  Verifique las listas de ingredientes para evitar alimentos con aceites parcialmente hidrogenados (grasas trans). QU ALIMENTOS  PUEDO COMER? Cereales Cereales integrales, como los panes de salvado o [place], las [place], los cereales y las pastas. Avena sin endulzar, trigo, [place], quinua o arroz integral. Tortillas de harina de maz o de salvado. Vegetales Verduras frescas o congeladas (crudas, al vapor, asadas o grilladas). Ensaladas de hojas verdes. Fruits Frutas frescas, en conserva (en su jugo natural) o frutas congeladas. Carnes y otros productos con protenas Carne de res molida (al 85% o ms [place]), carne de res de animales alimentados con pastos o carne de res sin la grasa. Pollo o pavo sin piel. Carne de pollo o de [place]. Cerdo sin la grasa. Todos los pescados y frutos de mar. Huevos. Porotos, guisantes o lentejas secos. Frutos secos o semillas sin sal. Frijoles secos o en lata sin sal. Lcteos Productos lcteos con bajo contenido de grasas, como [place] o al 1%, quesos reducidos en grasas o al 2%, ricota con bajo contenido de grasas o [employer], o yogur natural con bajo contenido de [place]. Grasas y Writer en barra que no contengan grasas trans. [place]ones[place] y condimentos para ensaladas livianos o reducidos en grasas. [place]. Aceites de crtamo, oliva o canola. Mantequilla natural de man o almendra. Los artculos mencionados arriba pueden no ser Raytheon de las bebidas o los alimentos recomendados. Comunquese con el nutricionista para conocer ms opciones. QU ALIMENTOS NO SE RECOMIENDAN? Cereales Pan blanco. Pastas blancas. Arroz blanco. Pan de maz. Bagels, pasteles y croissants. Galletas saladas que contengan grasas trans. Vegetales Papas blancas. Maz. Vegetales con crema o fritos. Verduras en salsa de [place]. Fruits Frutas secas. Fruta enlatada en almbar liviano o espeso. Jugo de frutas. Carnes y otros productos con protenas Cortes de carne con Holiday representative. Costillas, alas de pollo, tocineta, salchicha, mortadela, salame, chinchulines, tocino, perros calientes,  salchichas alemanas y embutidos envasados. Lcteos Leche entera o al 2%, crema, mezcla de [place] y crema y queso crema. Yogur entero o endulzado. Quesos con toda su grasa. Cremas no lcteas y coberturas batidas. Quesos procesados, quesos para untar o cuajadas. Dulces y postres Jarabe de maz, azcares, miel y Radio broadcast assistant. Caramelos. Mermelada y [place]. Doreen Beam. Cereales endulzados. Galletas, pasteles, bizcochuelos, donas, muffins y helado. Grasas y [address], [place] en barra, [place] de [place], [place], [place] clarificada o grasa de tocino. Aceites de coco, de palmiste o de palma. Bebidas Alcohol. Bebidas endulzadas (como refrescos, limonadas y bebidas frutales o ponches). Los artculos mencionados arriba pueden no ser Raytheon de las bebidas y los alimentos que se Theatre stage manager. Comunquese con el nutricionista para recibir ms informacin. Esta informacin no tiene Theme park manager el consejo del mdico. Asegrese de hacerle al mdico cualquier pregunta que tenga. Document Released: 10/18/2009 Document Revised: 05/05/2013 Document Reviewed: 03/04/2013 Elsevier Interactive Patient Education  2017 [employer].

## 2016-08-17 ENCOUNTER — Other Ambulatory Visit (INDEPENDENT_AMBULATORY_CARE_PROVIDER_SITE_OTHER): Payer: Self-pay

## 2016-08-17 DIAGNOSIS — E785 Hyperlipidemia, unspecified: Secondary | ICD-10-CM

## 2016-08-17 LAB — LIPID PANEL
CHOL/HDL RATIO: 2.2 ratio (ref <5.0)
CHOLESTEROL: 139 mg/dL (ref <200)
HDL: 63 mg/dL (ref >40)
LDL Cholesterol: 60 mg/dL (ref <100)
Triglycerides: 78 mg/dL (ref <150)
VLDL: 16 mg/dL (ref <30)

## 2016-08-20 MED FILL — ATORVASTATIN 80 MG TABLET: 80 | 30 days supply | Qty: 30 | Fill #2

## 2016-08-20 MED FILL — AMLODIPINE BESYLATE 10 MG T: 10 | 30 days supply | Qty: 30 | Fill #1

## 2016-09-24 MED FILL — LISINOPRIL 40 MG TABLET: 40 | 30 days supply | Qty: 30 | Fill #1

## 2016-09-24 MED FILL — HYDROCHLOROTHIAZIDE 25 MG T: 25 | 30 days supply | Qty: 30 | Fill #1

## 2016-09-24 MED FILL — ATORVASTATIN 80 MG TABLET: 80 | 30 days supply | Qty: 30 | Fill #3

## 2016-09-24 MED FILL — AMLODIPINE BESYLATE 10 MG T: 10 | 30 days supply | Qty: 30 | Fill #2

## 2016-12-03 MED FILL — LISINOPRIL 40 MG TABLET: 40 | 30 days supply | Qty: 30 | Fill #2

## 2016-12-03 MED FILL — AMLODIPINE BESYLATE 10 MG T: 10 | 30 days supply | Qty: 30 | Fill #3

## 2016-12-03 MED FILL — ATORVASTATIN 80 MG TABLET: 80 | 30 days supply | Qty: 30 | Fill #4

## 2016-12-03 MED FILL — HYDROCHLOROTHIAZIDE 25 MG T: 25 | 30 days supply | Qty: 30 | Fill #2

## 2017-01-23 MED FILL — HYDROCHLOROTHIAZIDE 25 MG T: 25 | 30 days supply | Qty: 30 | Fill #3

## 2017-01-23 MED FILL — ATORVASTATIN 80 MG TABLET: 80 | 30 days supply | Qty: 30 | Fill #5

## 2017-01-23 MED FILL — LISINOPRIL 40 MG TABLET: 40 | 30 days supply | Qty: 30 | Fill #3

## 2017-01-23 MED FILL — AMLODIPINE BESYLATE 10 MG T: 10 | 30 days supply | Qty: 30 | Fill #4

## 2017-03-28 ENCOUNTER — Other Ambulatory Visit: Payer: Self-pay | Admitting: Family Medicine

## 2017-03-28 DIAGNOSIS — I160 Hypertensive urgency: Secondary | ICD-10-CM

## 2017-03-28 DIAGNOSIS — E785 Hyperlipidemia, unspecified: Secondary | ICD-10-CM

## 2017-03-29 ENCOUNTER — Other Ambulatory Visit: Payer: Self-pay

## 2017-03-29 DIAGNOSIS — I10 Essential (primary) hypertension: Secondary | ICD-10-CM

## 2017-03-29 MED ORDER — HYDROCHLOROTHIAZIDE 25 MG PO TABS: 25.0000 mg | ORAL_TABLET | Freq: Every day | ORAL | 0 refills | Status: DC

## 2017-04-10 MED FILL — ATORVASTATIN 80 MG TABLET: 80 | 30 days supply | Qty: 30 | Fill #0

## 2017-04-10 MED FILL — LISINOPRIL 40 MG TAB: 40 | 30 days supply | Qty: 30 | Fill #4

## 2017-04-10 MED FILL — HYDROCHLOROTHIAZIDE 25 MG T: 25 | 30 days supply | Qty: 30 | Fill #4

## 2017-06-25 ENCOUNTER — Encounter: Payer: Self-pay | Admitting: Family Medicine

## 2017-06-25 ENCOUNTER — Ambulatory Visit (INDEPENDENT_AMBULATORY_CARE_PROVIDER_SITE_OTHER): Payer: Self-pay | Admitting: Family Medicine

## 2017-06-25 DIAGNOSIS — E785 Hyperlipidemia, unspecified: Secondary | ICD-10-CM

## 2017-06-25 DIAGNOSIS — I10 Essential (primary) hypertension: Secondary | ICD-10-CM

## 2017-06-25 LAB — POCT URINALYSIS DIP (DEVICE)
Bilirubin Urine: NEGATIVE
GLUCOSE, UA: NEGATIVE mg/dL
Hgb urine dipstick: NEGATIVE
Ketones, ur: NEGATIVE mg/dL
LEUKOCYTES UA: NEGATIVE
Nitrite: NEGATIVE
Protein, ur: NEGATIVE mg/dL
Specific Gravity, Urine: 1.025 (ref 1.005–1.030)
UROBILINOGEN UA: 0.2 mg/dL (ref 0.0–1.0)
pH: 5.5 (ref 5.0–8.0)

## 2017-06-25 MED ORDER — ASPIRIN 325 MG PO TBEC: 325.0000 mg | DELAYED_RELEASE_TABLET | Freq: Every day | ORAL | 1 refills | Status: AC

## 2017-06-25 MED ORDER — HYDROCHLOROTHIAZIDE 25 MG PO TABS: 25.0000 mg | ORAL_TABLET | Freq: Every day | ORAL | 1 refills | Status: AC

## 2017-06-25 MED ORDER — LISINOPRIL 40 MG PO TABS: 40.0000 mg | ORAL_TABLET | Freq: Every day | ORAL | 1 refills | Status: AC

## 2017-06-25 MED ORDER — AMLODIPINE BESYLATE 10 MG PO TABS: 10.0000 mg | ORAL_TABLET | Freq: Every day | ORAL | 1 refills | Status: AC

## 2017-06-25 MED ORDER — ATORVASTATIN CALCIUM 80 MG PO TABS: ORAL_TABLET | ORAL | 2 refills | Status: AC

## 2017-06-25 NOTE — Patient Instructions (Signed)
Hipertensin  Hypertension  El trmino hipertensin es otra forma de denominar a la presin arterial elevada. La presin arterial elevada fuerza al corazn a trabajar ms para bombear la sangre. Esto puede causar problemas con el paso del tiempo.  Una lectura de presin arterial est compuesta por 2 nmeros. Hay un nmero superior (sistlico) sobre un nmero inferior (diastlico). Lo ideal es tener la presin arterial por debajo de 120/80. Las decisiones saludables pueden ayudarle a disminuir su presin arterial. Es posible que necesite medicamentos que le ayuden a disminuir su presin arterial si:   Su presin arterial no disminuye mediante decisiones saludables.   Su presin arterial est por encima de 130/80.    Siga estas instrucciones en su casa:  Comida y bebida   Si se lo indican, siga el plan de alimentacin de DASH (Dietary Approaches to Stop Hypertension, Maneras de alimentarse para detener la hipertensin). Esta dieta incluye:  ? Que la mitad del plato de cada comida sea de frutas y verduras.  ? Que un cuarto del plato de cada comida sea de cereales integrales. Los cereales integrales incluyen pasta integral, arroz integral y pan integral.  ? Comer y beber productos lcteos con bajo contenido de grasa, como leche descremada o yogur bajo en grasas.  ? Que un cuarto del plato de cada comida sea de protenas bajas en grasa (magras). Las protenas bajas en grasa incluyen pescado, pollo sin piel, huevos, frijoles y tofu.  ? Evitar consumir carne grasa, carne curada y procesada, o pollo con piel.  ? Evitar consumir alimentos prehechos o procesados.   Consuma menos de 1500 mg de sal (sodio) por da.   Limite el consumo de alcohol a no ms de 1 medida por da si es mujer y no est embarazada y a 2 medidas por da si es hombre. Una medida equivale a 12onzas de cerveza, 5onzas de vino o 1onzas de bebidas alcohlicas de alta graduacin.  Estilo de vida   Trabaje con su mdico para mantenerse en un peso  saludable o para perder peso. Pregntele a su mdico cul es el peso recomendable para usted.   Realice al menos 30 minutos de ejercicio que haga que se acelere su corazn (ejercicio aerbico) la mayora de los das de la semana. Estos pueden incluir caminar, nadar o andar en bicicleta.   Realice al menos 30 minutos de ejercicio que fortalezca sus msculos (ejercicios de resistencia) al menos 3 das a la semana. Estos pueden incluir levantar pesas o hacer pilates.   No consuma ningn producto que contenga nicotina o tabaco. Esto incluye cigarrillos y cigarrillos electrnicos. Si necesita ayuda para dejar de fumar, consulte al mdico.   Controle su presin arterial en su casa tal como le indic el mdico.   Concurra a todas las visitas de control como se lo haya indicado el mdico. Esto es importante.  Medicamentos   Tome los medicamentos de venta libre y los recetados solamente como se lo haya indicado el mdico. Siga cuidadosamente las indicaciones.   No omita las dosis de medicamentos para la presin arterial. Los medicamentos pierden eficacia si omite dosis. El hecho de omitir las dosis tambin aumenta el riesgo de otros problemas.   Pregntele a su mdico a qu efectos secundarios o reacciones a los medicamentos debe prestar atencin.  Comunquese con un mdico si:   Piensa que tiene una reaccin a los medicamentos que est tomando.   Tiene dolores de cabeza frecuentes (recurrentes).   Siente mareos.   Tiene hinchazn   Siente que va a desmayarse.  Siente un dolor muy intenso en: ? El pecho. ? El vientre (abdomen).  Devuelve (vomita) ms de una vez.  Tiene dificultad para respirar. Resumen  El trmino hipertensin es otra forma de denominar a la presin arterial elevada.  Las decisiones saludables  pueden ayudarle a disminuir su presin arterial. Si no puede controlar su presin arterial mediante decisiones saludables, es posible que deba tomar medicamentos. Esta informacin no tiene Theme park manager el consejo del mdico. Asegrese de hacerle al mdico cualquier pregunta que tenga. Document Released: 10/18/2009 Document Revised: 04/11/2016 Document Reviewed: 04/11/2016 Elsevier Interactive Patient Education  2018 [employer].    Plan de alimentacin DASH DASH Eating Plan DASH es la sigla en ingls de "Enfoques Alimentarios para Detener la Hipertensin" (Dietary Approaches to Stop Hypertension). El plan de alimentacin DASH ha demostrado bajar la presin arterial elevada (hipertensin). Tambin puede reducir [employer] de diabetes tipo 2, enfermedad cardaca y accidente cerebrovascular. Este plan tambin puede ayudar a Geophysical data processor. Consejos para seguir este plan [place] generales  Evite ingerir ms de 2,300 mg (miligramos) de sal (sodio) por da. Si tiene hipertensin, es posible que necesite reducir la ingesta de sodio a 1,500 mg por da.  Limite el consumo de alcohol a no ms de por da si es mujer y no est [place], y por da si es hombre. Una medida equivale a 12oz ( ) de cerveza, 5oz ( ) de vino o 1oz (44ml) de bebidas alcohlicas de alta graduacin.  Trabaje con su mdico para mantener un peso saludable o perder [employer]. Pregntele cul es el peso recomendado para usted.  Realice al menos 30 minutos de ejercicio que haga que se acelere su corazn (ejercicio Magazine features editor) la [employer] de la [place]. Estas actividades pueden incluir caminar, nadar o andar en bicicleta.  Trabaje con su mdico o especialista en alimentacin y nutricin (nutricionista) para ajustar su plan alimentario a sus necesidades calricas personales. Lectura de las etiquetas de los alimentos  Verifique en las etiquetas de los alimentos, la cantidad de sodio por porcin.  Elija alimentos con menos del 5 por ciento del valor diario de sodio. Generalmente, los alimentos con menos de 300 mg de sodio por porcin se encuadran dentro de este plan alimentario.  Para encontrar cereales integrales, busque la palabra "integral" como primera palabra en la lista de ingredientes. De compras  Compre productos en los que en su etiqueta diga: "bajo contenido de sodio" o "sin agregado de sal".  Compre alimentos frescos. Evite los alimentos enlatados y comidas precocidas o congeladas. Coccin  Evite agregar sal cuando cocine. Use hierbas o aderezos sin sal, en lugar de sal de mesa o sal marina. Consulte al mdico o farmacutico antes de usar sustitutos de la sal.  No fra los alimentos. A la hora de cocinar los alimentos opte por hornearlos, hervirlos, grillarlos y asarlos a Patent attorney.  Cocine con aceites cardiosaludables, como oliva, canola, soja o girasol. Planificacin de las comidas   Consuma una dieta equilibrada, que incluya lo siguiente: ? 5o ms porciones de frutas y Warehouse manager. Trate de que la mitad del plato de cada comida sean frutas y verduras. ? Hasta 6 u 8 porciones de cereales integrales por da. ? Menos de 6 onzas de carne, aves o pescado Copy. Una porcin de 3 onzas de carne tiene casi el mismo tamao que un mazo de cartas. Un huevo equivale a 1 onza. ? Dos porciones de productos lcteos descremados  por Futures traderda. ? Una porcin de frutos secos, semillas o frijoles 5 veces por semana. ? Grasas cardiosaludables. Las grasas saludables llamadas cidos grasos omega-3 se encuentran en alimentos como semillas de lino y pescados de agua fra, como por ejemplo, sardinas, salmn y caballa.  Limite la cantidad que ingiere de los siguientes alimentos: ? Alimentos enlatados o envasados. ? Alimentos con alto contenido de grasa trans, como alimentos fritos. ? Alimentos con alto contenido de grasa saturada, como carne con grasa. ? Dulces, postres, bebidas  azucaradas y otros alimentos con azcar agregada. ? Productos lcteos enteros.  No le agregue sal a los alimentos antes de probarlos.  Trate de comer al menos 2 comidas vegetarianas por semana.  Consuma ms comida casera y menos de restaurante, de bufs y comida rpida.  Cuando coma en un restaurante, pida que preparen su comida con menos sal o, en lo posible, sin nada de sal. Qu alimentos se recomiendan? Los alimentos enumerados a continuacin no constituyen Water quality scientistuna lista completa. Hable con el nutricionista sobre las mejores opciones alimenticias para usted. Cereales Pan de salvado o integral. Pasta de salvado o integral. Arroz integral. Avena. Quinua. Trigo burgol. Cereales integrales y con bajo contenido de Montereysodio. Pan pita. Galletitas de [place]agua con bajo contenido de [place]grasa y Alamosodio. Tortillas de [place] integral. Verduras Verduras frescas o congeladas (crudas, al vapor, asadas o grilladas). Jugos de tomate y verduras con bajo contenido de sodio o reducidos en sodio. Salsa y pasta de tomate con bajo contenido de sodio o reducidas en sodio. Verduras enlatadas con bajo contenido de sodio o reducidas en sodio. Frutas Todas las frutas frescas, congeladas o disecadas. Frutas enlatadas en jugo natural (sin agregado de azcar). Carne y otros alimentos proteicos Pollo o pavo sin piel. Carne de pollo o de Rossmoorpavo molida. Cerdo desgrasado. Pescado y Liberty Globalmariscos. Claras de huevo. Porotos, guisantes o lentejas secos. Frutos secos, mantequilla de frutos secos y semillas sin sal. Frijoles enlatados sin sal. Cortes de carne vacuna magra, desgrasada. Embutidos magros, con bajo contenido de Highpointsodio. Lcteos Leche descremada (1%) o descremada. Quesos sin grasa, con bajo contenido de grasa o descremados. Queso blanco o ricota sin grasa, con bajo contenido de Smithtonsodio. Yogur semidescremado o descremado. Queso con bajo contenido de [place]grasa y [place] Stationsodio. Grasas y Hershey Companyaceites Margarinas untables que no contengan grasas trans. Aceite  vegetal. Jerolyn ShinMayonesa y aderezos para ensaladas livianos o con bajo contenido de grasas (reducidos en sodio). Aceite de canola, crtamo, oliva, soja y Lemannvillegirasol. Aguacate. Condimentos y otros alimentos Hierbas. Especias. Mezclas de condimentos sin sal. Palomitas de maz y pretzels sin sal. Dulces con bajo contenido de grasas. Qu alimentos no se recomiendan? Los alimentos enumerados a continuacin no constituyen Water quality scientistuna lista completa. Hable con el nutricionista sobre las mejores opciones alimenticias para usted. Cereales Productos de panificacin hechos con grasa, como medialunas, magdalenas y algunos panes. Comidas con arroz o pasta seca listas para usar. Verduras Verduras con crema o fritas. Verduras en salsa de Aiqueso. Verduras enlatadas regulares (que no sean con bajo contenido de sodio o reducidas en sodio). Pasta y salsa de tomates enlatadas regulares (que no sean con bajo contenido de sodio o reducidas en sodio). Jugos de tomate y verduras regulares (que no sean con bajo contenido de sodio o reducidos en sodio). Pepinillos. Aceitunas. Nils PyleFrutas Fruta enlatada en almbar liviano o espeso. Frutas cocidas en aceite. Frutas con salsa de crema o Bonomanteca. Carne y otros alimentos proteicos Cortes de carne con grasa. Costillas. Carne frita. Tocino. Salchichas. Mortadela y otras carnes procesadas. Salame.  Panceta. Perros calientes (hotdogs). Salchicha de cerdo. Frutos secos y semillas con sal. Frijoles enlatados con agregado de sal. Pescado enlatado o ahumado. Huevos enteros o yemas. Pollo o pavo con piel. Lcteos Leche entera o al 2%, crema y [address] y mitad crema. Queso crema entero o con toda su grasa. Yogur entero o endulzado. Quesos con toda su grasa. Sustitutos de cremas no lcteas. Coberturas batidas. Quesos para untar y quesos procesados. Grasas y [employer]. Margarina en barra. Manteca de cerdo. Materia grasa. Mantequilla clarificada. Grasa de panceta. Aceites tropicales como aceite de coco,  palmiste o palma. Condimentos y otros alimentos Palomitas de maz y pretzels con sal. Sal de cebolla, sal de ajo, sal condimentada, sal de mesa y sal marina. Salsa Worcestershire. Salsa trtara. Salsa barbacoa. Salsa teriyaki. Salsa de soja, incluso la que tiene contenido reducido de [place]. Salsa de carne. Salsas en lata y envasadas. Salsa de pescado. Salsa de [place]. Salsa rosada. Rbano picante envasado. [place]. Mostaza. Saborizantes y tiernizantes para carne. Caldo en cubitos. Salsa picante y salsa tabasco. Escabeches envasados o ya preparados. Aderezos para tacos prefabricados o envasados. Salsas. Aderezos comunes para ensalada. Dnde encontrar ms informacin:  [employer] del [address], los Pulmones y Risk manager (National Heart, Lung, and Blood Institute): PopSteam.is  Asociacin Estadounidense del Corazn (American Heart Association): www.heart.org Resumen  El plan de alimentacin DASH ha demostrado bajar la presin arterial elevada (hipertensin). Tambin puede reducir [employer] International de diabetes tipo 2, enfermedad cardaca y accidente cerebrovascular.  Con el plan de alimentacin DASH, deber limitar el consumo de sal (sodio) a 2,300 mg por da. Si tiene hipertensin, es posible que necesite reducir la ingesta de sodio a 1,500 mg por da.  Cuando siga el plan de alimentacin DASH, trate de comer ms frutas frescas y verduras, cereales integrales, carnes magras, lcteos descremados y grasas cardiosaludables.  Trabaje con su mdico o especialista en alimentacin y nutricin (nutricionista) para ajustar su plan alimentario a sus necesidades calricas personales. Esta informacin no tiene Theme park manager el consejo del mdico. Asegrese de hacerle al mdico cualquier pregunta que tenga. Document Released: 04/19/2011 Document Revised: 08/20/2016 Document Reviewed: 08/20/2016 Elsevier Interactive Patient Education  [employer].

## 2017-06-25 NOTE — Progress Notes (Signed)
Patient ID: Chris Phelps, male    DOB: 1975/09/16, 42 y.o.   MRN: [id number]  PCP: Bing Neighbors, FNP  Chief Complaint  Patient presents with  . Follow-up    HTN    Subjective:  HPI Chris Phelps is a 42 y.o. male with hypertension and overweight, presents for hypertension follow-up and medication refill. He has been out of medications for a few days. Reports no home monitoring of blood pressure.  Reports adherence to blood pressure medications. Reports continue sodium use. He is a nonsmoker. Denies any episodes of dizziness,headaches, shortness of breath, or chest pain. 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 No recent, ophthalmology follow-up. He requests refills of medication and has no other complaints or concerns.  Social History   Socioeconomic History  . Marital status: Single    Spouse name: Not on file  . Number of children: Not on file  . Years of education: Not on file  . Highest education level: Not on file  Social Needs  . Financial resource strain: Not on file  . Food insecurity - worry: Not on file  . Food insecurity - inability: Not on file  . Transportation needs - medical: Not on file  . Transportation needs - non-medical: Not on file  Occupational History  . Not on file  Tobacco Use  . Smoking status: Never Smoker  . Smokeless tobacco: Never Used  Substance and Sexual Activity  . Alcohol use: Yes    Alcohol/week: 0.6 oz    Types: 1 Cans of beer per week    Comment: social beer  . Drug use: No    Comment: past marijuana use x 2 years ago  . Sexual activity: Yes  Other Topics Concern  . Not on file  Social History Narrative  . Not on file    Family History  Problem Relation Age of Onset  . Hypertension Unknown    Review of Systems Pertinent negatives indicated in HPI Patient Active Problem List   Diagnosis Date Noted  . Language barrier to communication 10/06/2015  . H/O: CVA  (cerebrovascular accident) 10/06/2015  . Vision, loss, sudden 10/03/2015  . Dyslipidemia 09/06/2015  . Cerebral thrombosis with cerebral infarction 08/19/2015  . PRES (posterior reversible encephalopathy syndrome) 08/19/2015  . Renal failure 08/18/2015  . Abnormal MRI   . Renal insufficiency 08/17/2015  . Leukocytosis 08/17/2015  . Hypokalemia 08/17/2015  . Sinus tachycardia 08/17/2015  . Visual changes 08/17/2015    No Known Allergies  Prior to Admission medications   Medication Sig Start Date End Date Taking? Authorizing Provider  amLODipine (NORVASC) 10 MG tablet TAKE 1 TABLET BY MOUTH DAILY. 03/28/17  Yes Massie Maroon, FNP  aspirin 325 MG EC tablet Take 1 tablet (325 mg total) by mouth daily. 03/27/16  Yes Henrietta Hoover, NP  atorvastatin (LIPITOR) 80 MG tablet TAKE 1 TABLET BY MOUTH ONCE DAILY AT 6 PM 03/28/17  Yes Massie Maroon, FNP  hydrochlorothiazide (HYDRODIURIL) 25 MG tablet Take 1 tablet (25 mg total) daily by mouth. Needs appointment for more refills 03/29/17  Yes Massie Maroon, FNP  lisinopril (PRINIVIL,ZESTRIL) 40 MG tablet Take 1 tablet (40 mg total) by mouth daily. 07/31/16  Yes Massie Maroon, FNP  amLODipine (NORVASC) 10 MG tablet Take 1 tablet (10 mg total) by mouth daily. 03/27/16   Henrietta Hoover, NP    Past Medical, Surgical Family and Social History reviewed and updated.  Objective:   Today's Vitals   06/25/17 1358 06/25/17 1427  BP: (!) 160/80 (!) 142/80  Pulse: 64   Resp: 16   Temp: 97.9 F (36.6 C)   TempSrc: Oral   SpO2: 100%   Weight: 153 lb 6.4 oz (69.6 kg)   Height: 5\' 4"  (1.626 m)     Wt Readings from Last 3 Encounters:  06/25/17 153 lb 6.4 oz (69.6 kg)  07/31/16 164 lb (74.4 kg)  03/27/16 162 lb (73.5 kg)   Physical Exam Constitutional: Patient appears well-developed and well-nourished. No distress. HENT: Normocephalic, atraumatic, External right and left ear normal. Oropharynx is clear and moist.  Eyes:  Conjunctivae and EOM are normal. PERRLA, no scleral icterus. Neck: Normal ROM. Neck supple. No JVD. No tracheal deviation. No thyromegaly. CVS: RRR, S1/S2 +, no murmurs, no gallops, no carotid bruit.  Pulmonary: Effort and breath sounds normal, no stridor, rhonchi, wheezes, rales.  Abdominal: Soft. BS +, no distension, tenderness, rebound or guarding.  Musculoskeletal: Normal range of motion. No edema and no tenderness.  Lymphadenopathy: No lymphadenopathy noted, cervical, inguinal or axillary Neuro: Alert. Normal reflexes, muscle tone coordination. No cranial nerve deficit. Skin: Skin is warm and dry. No rash noted. Not diaphoretic. No erythema. No pallor. Psychiatric: Normal mood and affect. Behavior, judgment, thought content normal.   Assessment & Plan:  1. Essential hypertension, stable, medication refilled.  We have discussed target BP range and blood pressure goal. I have advised patient to check BP regularly and to call us back or report to clinic if the numbers are consistently higher than 140/90. We discussed the importance of compliance with medical therapy and DASH diet recommended, consequences of uncontrolled hypertension discussed.   2. Dyslipidemia,check a fasting lipid panel. Continue atorvastatin. Continue DASH diet and limiting foods rich in animal fat.  Meds ordered this encounter  Medications  . hydrochlorothiazide (HYDRODIURIL) 25 MG tablet    Sig: Take 1 tablet (25 mg total) by mouth daily. Needs appointment for more refills    Dispense:  90 tablet    Refill:  1    Order Specific Question:   Supervising Provider    Answer:   Quentin AngstJEGEDE, OLUGBEMIGA E [id number]]  . lisinopril (PRINIVIL,ZESTRIL) 40 MG tablet    Sig: Take 1 tablet (40 mg total) by mouth daily.    Dispense:  90 tablet    Refill:  1    Order Specific Question:   Supervising Provider    Answer:   Quentin AngstJEGEDE, OLUGBEMIGA E [id number]]  . aspirin 325 MG EC tablet    Sig: Take 1 tablet (325 mg total) by mouth daily.     Dispense:  90 tablet    Refill:  1    Order Specific Question:   Supervising Provider    Answer:   Quentin AngstJEGEDE, OLUGBEMIGA E [id number]]  . amLODipine (NORVASC) 10 MG tablet    Sig: Take 1 tablet (10 mg total) by mouth daily.    Dispense:  90 tablet    Refill:  1    Order Specific Question:   Supervising Provider    Answer:   Quentin AngstJEGEDE, OLUGBEMIGA E [id number]]  . atorvastatin (LIPITOR) 80 MG tablet    Sig: TAKE 1 TABLET BY MOUTH ONCE DAILY AT 6 PM    Dispense:  90 tablet    Refill:  2    Order Specific Question:   Supervising Provider    Answer:   Quentin AngstJEGEDE, OLUGBEMIGA E [id number]]   Orders Placed This Encounter  Procedures  .  Comprehensive metabolic panel  . CBC with Differential  . TSH  . Lipid panel    Order Specific Question:   Has the patient fasted?    Answer:   No  . POCT urinalysis dip (device)    Return for care 6 months for hypertension follow-up.     Godfrey Pick. Tiburcio Pea, MSN, FNP-C [facility]  [address] Sherian Maroon [place], [place] [address] [phone]

## 2017-06-26 LAB — CBC WITH DIFFERENTIAL/PLATELET
Basophils Absolute: 0 10*3/uL (ref 0.0–0.2)
Basos: 0 %
EOS (ABSOLUTE): 0.1 10*3/uL (ref 0.0–0.4)
EOS: 2 %
HEMATOCRIT: 42.7 % (ref 37.5–51.0)
HEMOGLOBIN: 14.6 g/dL (ref 13.0–17.7)
IMMATURE GRANS (ABS): 0 10*3/uL (ref 0.0–0.1)
Immature Granulocytes: 0 %
LYMPHS: 26 %
Lymphocytes Absolute: 1.7 10*3/uL (ref 0.7–3.1)
MCH: 32.9 pg (ref 26.6–33.0)
MCHC: 34.2 g/dL (ref 31.5–35.7)
MCV: 96 fL (ref 79–97)
MONOCYTES: 5 %
Monocytes Absolute: 0.3 10*3/uL (ref 0.1–0.9)
NEUTROS ABS: 4.4 10*3/uL (ref 1.4–7.0)
Neutrophils: 67 %
Platelets: 305 10*3/uL (ref 150–379)
RBC: 4.44 x10E6/uL (ref 4.14–5.80)
RDW: 13.8 % (ref 12.3–15.4)
WBC: 6.6 10*3/uL (ref 3.4–10.8)

## 2017-06-26 LAB — LIPID PANEL
Chol/HDL Ratio: 2.8 ratio (ref 0.0–5.0)
Cholesterol, Total: 147 mg/dL (ref 100–199)
HDL: 52 mg/dL (ref >39)
LDL CALC: 72 mg/dL (ref 0–99)
Triglycerides: 117 mg/dL (ref 0–149)
VLDL CHOLESTEROL CAL: 23 mg/dL (ref 5–40)

## 2017-06-26 LAB — COMPREHENSIVE METABOLIC PANEL
ALT: 20 IU/L (ref 0–44)
AST: 15 IU/L (ref 0–40)
Albumin/Globulin Ratio: 1.8 (ref 1.2–2.2)
Albumin: 4.6 g/dL (ref 3.5–5.5)
Alkaline Phosphatase: 49 IU/L (ref 39–117)
BUN / CREAT RATIO: 19 (ref 9–20)
BUN: 20 mg/dL (ref 6–24)
Bilirubin Total: 0.5 mg/dL (ref 0.0–1.2)
CALCIUM: 9.1 mg/dL (ref 8.7–10.2)
CO2: 24 mmol/L (ref 20–29)
CREATININE: 1.03 mg/dL (ref 0.76–1.27)
Chloride: 101 mmol/L (ref 96–106)
GFR, EST AFRICAN AMERICAN: 104 mL/min/{1.73_m2} (ref >59)
GFR, EST NON AFRICAN AMERICAN: 90 mL/min/{1.73_m2} (ref >59)
GLUCOSE: 89 mg/dL (ref 65–99)
Globulin, Total: 2.5 g/dL (ref 1.5–4.5)
Potassium: 4.3 mmol/L (ref 3.5–5.2)
Sodium: 139 mmol/L (ref 134–144)
TOTAL PROTEIN: 7.1 g/dL (ref 6.0–8.5)

## 2017-06-26 LAB — TSH: TSH: 2.22 u[IU]/mL (ref 0.450–4.500)

## 2017-07-04 ENCOUNTER — Encounter: Payer: Self-pay | Admitting: Family Medicine

## 2017-07-08 MED FILL — LISINOPRIL 40 MG TAB: 40 | 30 days supply | Qty: 30 | Fill #0

## 2017-07-08 MED FILL — HYDROCHLOROTHIAZIDE 25 MG T: 25 | 30 days supply | Qty: 30 | Fill #0

## 2017-07-08 MED FILL — ATORVASTATIN 80 MG TABLET: 80 | 30 days supply | Qty: 30 | Fill #0

## 2017-07-08 MED FILL — AMLODIPINE BESYLATE 10 MG T: 10 | 30 days supply | Qty: 30 | Fill #0

## 2017-07-19 ENCOUNTER — Ambulatory Visit: Payer: Self-pay | Attending: Family Medicine

## 2017-09-16 MED FILL — AMLODIPINE BESYLATE 10 MG T: 10 | 30 days supply | Qty: 30 | Fill #1

## 2017-09-16 MED FILL — ATORVASTATIN 80 MG TABLET: 80 | 30 days supply | Qty: 30 | Fill #1

## 2017-09-16 MED FILL — LISINOPRIL 40 MG TABLET: 40 | 30 days supply | Qty: 30 | Fill #1

## 2017-09-16 MED FILL — HYDROCHLOROTHIAZIDE 25 MG T: 25 | 30 days supply | Qty: 30 | Fill #1

## 2017-09-27 ENCOUNTER — Ambulatory Visit: Payer: Self-pay | Admitting: Family Medicine

## 2017-10-01 ENCOUNTER — Ambulatory Visit: Payer: Self-pay | Admitting: Family Medicine

## 2017-12-09 IMAGING — CT CT HEAD W/O CM
2 series · 15 of 30 positions shown, 19 images · non-contrast
Comparison: None.

CLINICAL DATA: 39-year-old male with vision changes in the right
eye for 11 days. Hypertensive. Initial encounter.

EXAM:
CT HEAD WITHOUT CONTRAST
TECHNIQUE: Contiguous axial images were obtained from the base of the skull
through the vertex without intravenous contrast.

[Series 2: head w/o · axial · non-contrast · 0.45mm/px · z∈[-120,+5]mm · 13 of 31 slices shown, 17 images]
[im 3/31  brain]
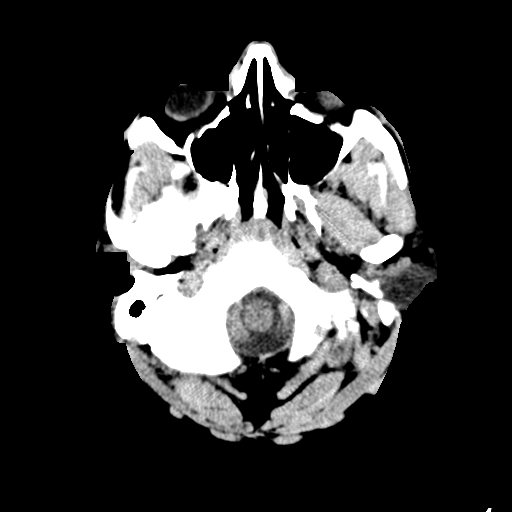
[im 3/31  bone]
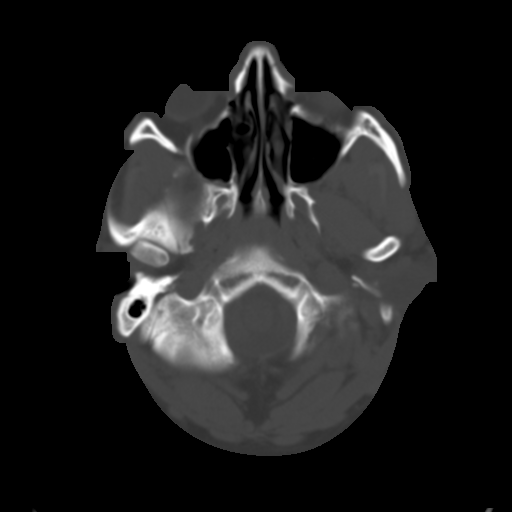
[im 5/31  brain]
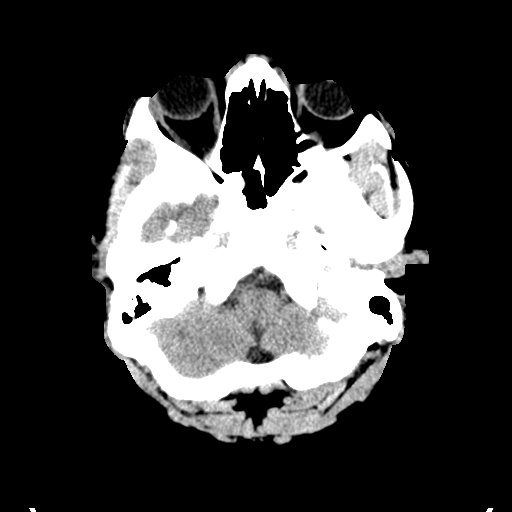
[im 7/31  brain]
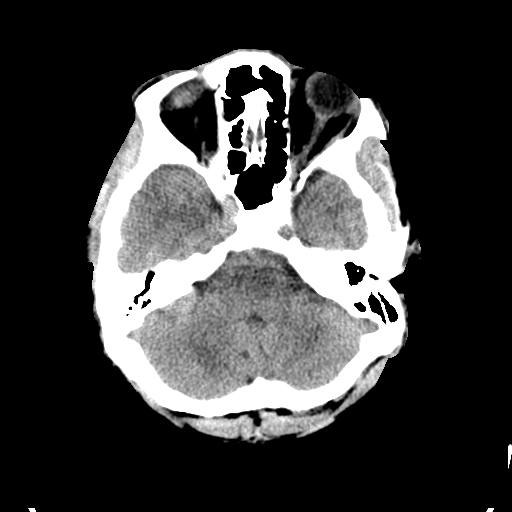
[im 9/31  brain]
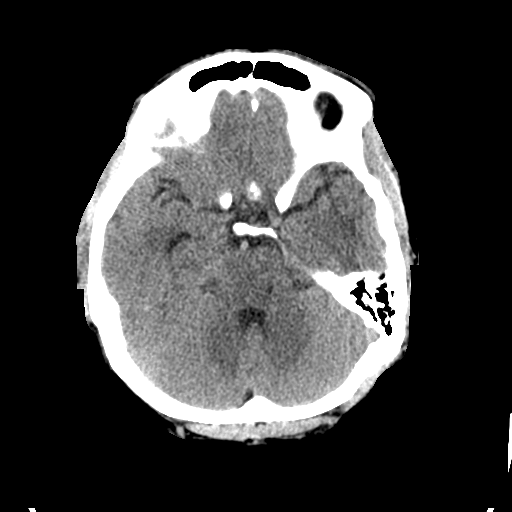
[im 11/31  brain]
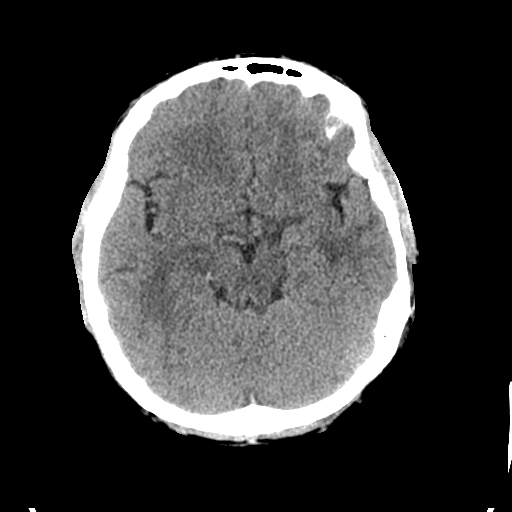
[im 11/31  bone]
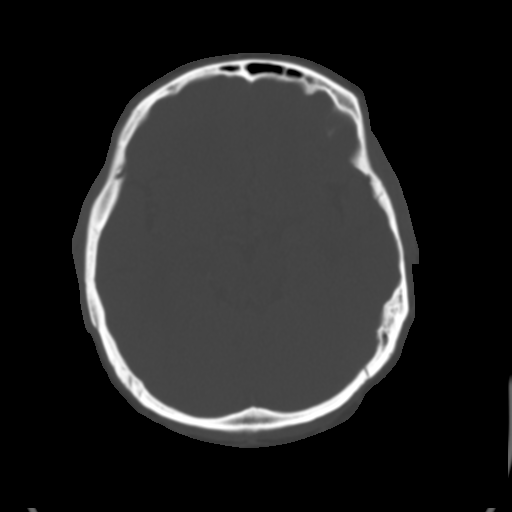
[im 13/31  brain]
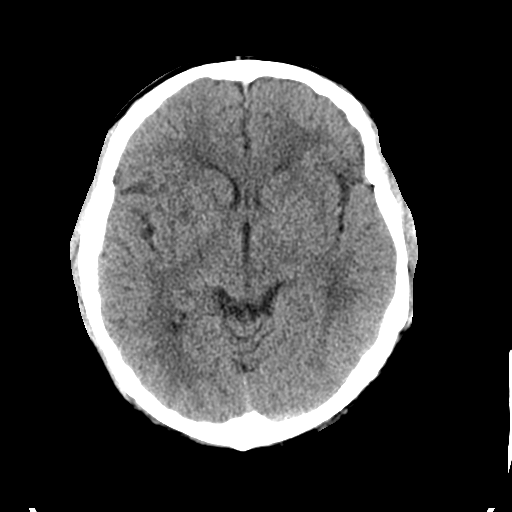
[im 16/31  brain]
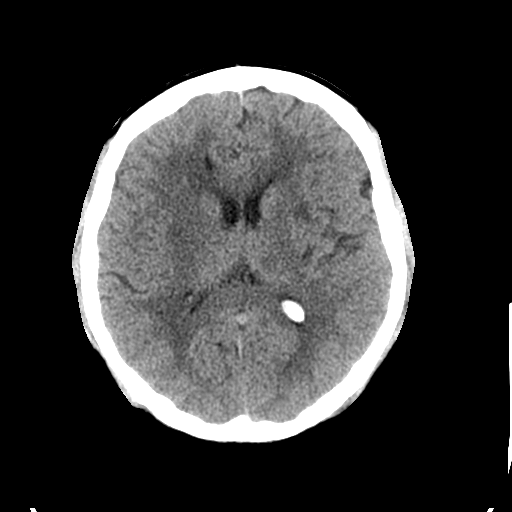
[im 18/31  brain]
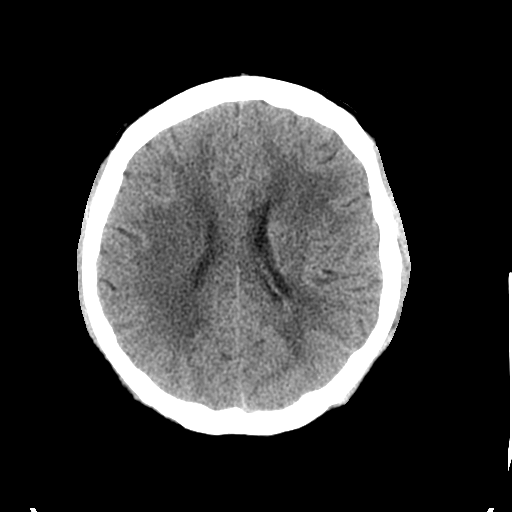
[im 20/31  brain]
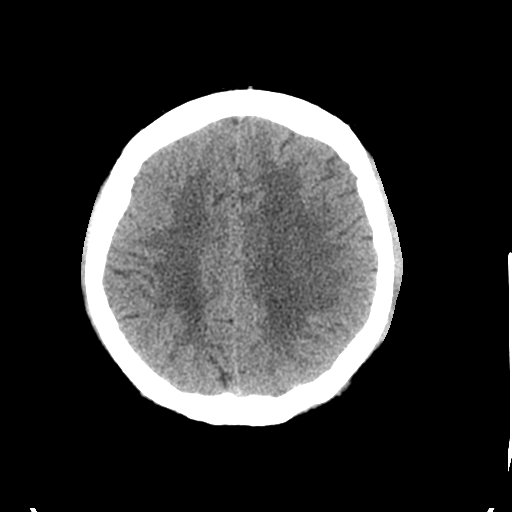
[im 20/31  bone]
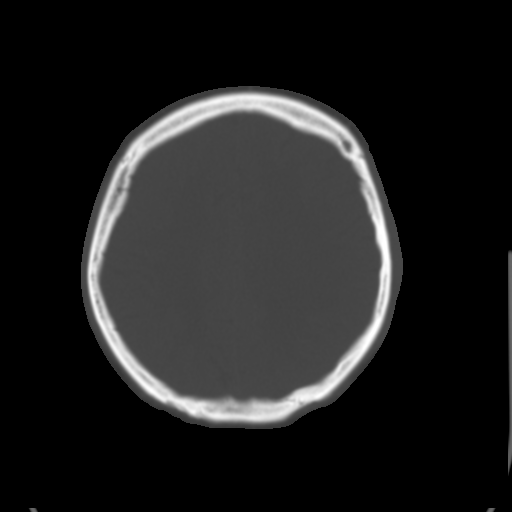
[im 22/31  brain]
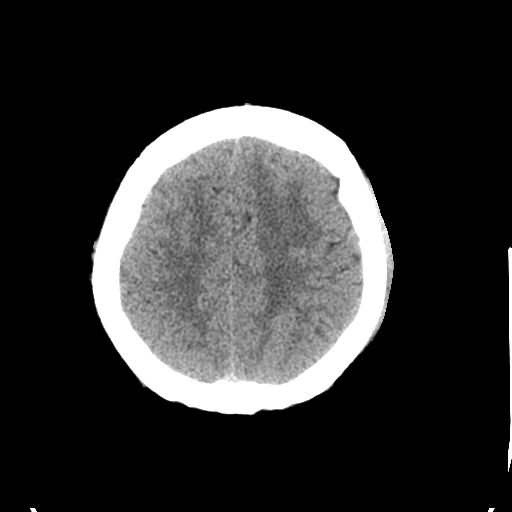
[im 24/31  brain]
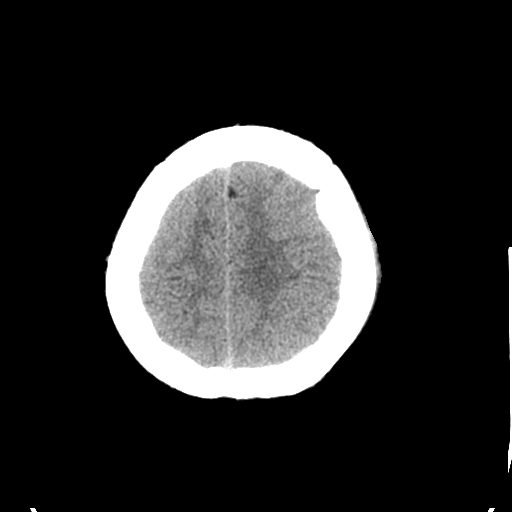
[im 26/31  brain]
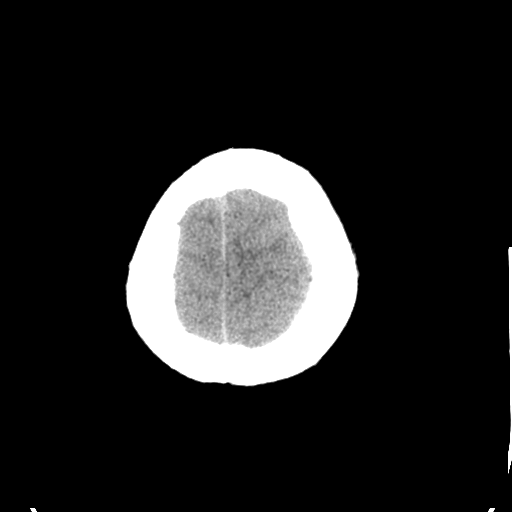
[im 28/31  brain]
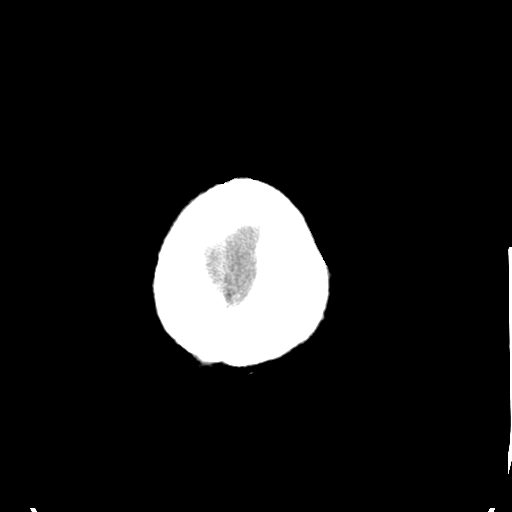
[im 28/31  bone]
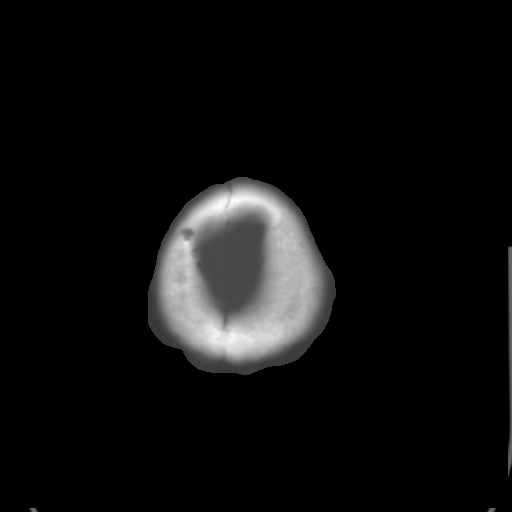

[Series 3: bone windows · axial · 0.45mm/px · z∈[-120,-100]mm · 2 of 31 slices shown]
[im 3/31  bone]
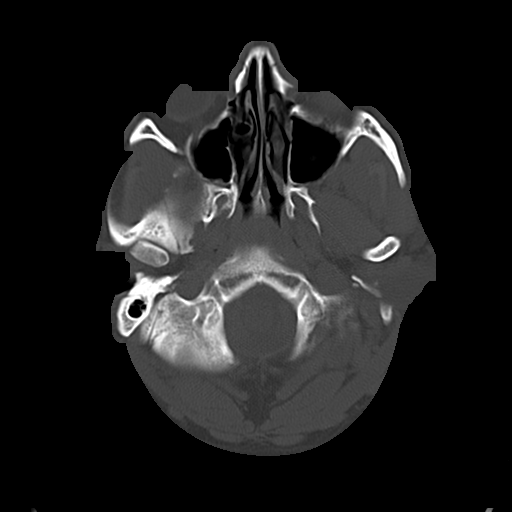
[im 7/31  bone]
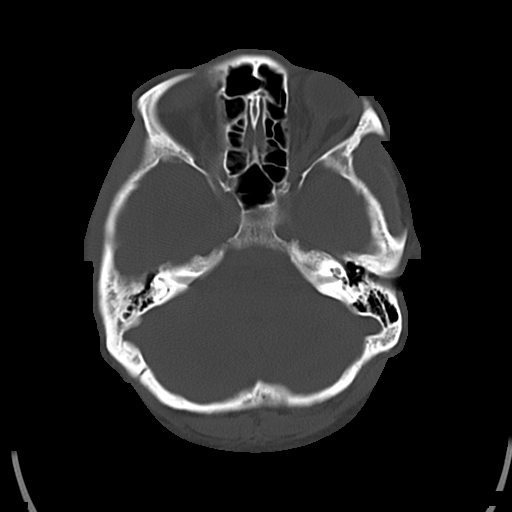

[15 of 30 positions shown; findings below may reference images not displayed]

FINDINGS: No acute osseous abnormality identified. Scattered mostly small
paranasal sinus mucous retention cyst. Mastoids are clear. Orbits
soft tissues appear normal. Visualized scalp soft tissues are within
normal limits.

Cerebral volume is normal. Multifocal abnormal hypodense areas in
the cerebral white matter and basal ganglia bilaterally suggestion
of a small area of cortical encephalomalacia in the right parietal
lobe on series 2, image 20. No cortically based acute infarct
identified. No midline shift, mass effect, or evidence of
intracranial mass lesion. No acute intracranial hemorrhage
identified. No ventriculomegaly. No suspicious intracranial vascular
hyperdensity.
IMPRESSION: 1. Heterogeneous density in the bilateral cerebral white matter and
basal ganglia is nonspecific but may indicate very age advanced
small vessel disease in this setting.
2. Otherwise no acute intracranial abnormality.

## 2018-02-26 MED FILL — AMLODIPINE BESYLATE 10 MG T: 10 | 30 days supply | Qty: 30 | Fill #2

## 2018-02-26 MED FILL — HYDROCHLOROTHIAZIDE 25 MG T: 25 | 30 days supply | Qty: 30 | Fill #2

## 2018-02-26 MED FILL — ATORVASTATIN 80 MG TABLET: 80 | 30 days supply | Qty: 30 | Fill #2

## 2018-02-26 MED FILL — LISINOPRIL 40 MG TABLET: 40 | 30 days supply | Qty: 30 | Fill #2

## 2018-06-17 MED FILL — HYDROCHLOROTHIAZIDE 25 MG T: 25 | 30 days supply | Qty: 30 | Fill #3

## 2018-06-17 MED FILL — AMLODIPINE BESYLATE 10 MG T: 10 | 30 days supply | Qty: 30 | Fill #3

## 2018-06-17 MED FILL — LISINOPRIL 40 MG TABLET: 40 | 30 days supply | Qty: 30 | Fill #3

## 2018-06-17 MED FILL — ATORVASTATIN 80 MG TABLET: 80 | 30 days supply | Qty: 30 | Fill #3

## 2018-10-07 ENCOUNTER — Other Ambulatory Visit: Payer: Self-pay | Admitting: Family Medicine

## 2018-10-07 DIAGNOSIS — E785 Hyperlipidemia, unspecified: Secondary | ICD-10-CM

## 2018-10-07 DIAGNOSIS — I10 Essential (primary) hypertension: Secondary | ICD-10-CM

## 2018-10-07 NOTE — Telephone Encounter (Signed)
Denver Faster! I believe this is one of your patients. He is requesting refills on Amlodipine, Atorvastatin, HCTZ, and Lisinopril. Hope all is well with you.

## 2022-01-24 ENCOUNTER — Ambulatory Visit: Payer: Self-pay | Admitting: Nurse Practitioner
